# Patient Record
Sex: Male | Born: 1935 | Race: White | Hispanic: No | Marital: Married | Smoking: Former smoker
Health system: Southern US, Community
[De-identification: ages and names within clinical notes are randomized; demographics above are authoritative.]

## PROBLEM LIST (undated history)

## (undated) DIAGNOSIS — M7580 Other shoulder lesions, unspecified shoulder: Secondary | ICD-10-CM

## (undated) DIAGNOSIS — I10 Essential (primary) hypertension: Secondary | ICD-10-CM

## (undated) DIAGNOSIS — M778 Other enthesopathies, not elsewhere classified: Secondary | ICD-10-CM

## (undated) DIAGNOSIS — E7800 Pure hypercholesterolemia, unspecified: Secondary | ICD-10-CM

## (undated) DIAGNOSIS — S46219A Strain of muscle, fascia and tendon of other parts of biceps, unspecified arm, initial encounter: Secondary | ICD-10-CM

## (undated) DIAGNOSIS — C801 Malignant (primary) neoplasm, unspecified: Secondary | ICD-10-CM

## (undated) HISTORY — PX: EYE SURGERY: SHX253

## (undated) HISTORY — PX: TONSILLECTOMY: SUR1361

## (undated) HISTORY — PX: APPENDECTOMY: SHX54

## (undated) HISTORY — PX: OTHER SURGICAL HISTORY: SHX169

## (undated) HISTORY — PX: VASECTOMY: SHX75

## (undated) HISTORY — PX: THYROIDECTOMY: SHX17

---

## 1999-07-19 ENCOUNTER — Encounter: Payer: Self-pay | Admitting: General Surgery

## 1999-07-20 ENCOUNTER — Ambulatory Visit (HOSPITAL_COMMUNITY): Admission: RE | Admit: 1999-07-20 | Discharge: 1999-07-20 | Payer: Self-pay | Admitting: General Surgery

## 1999-07-20 ENCOUNTER — Encounter (INDEPENDENT_AMBULATORY_CARE_PROVIDER_SITE_OTHER): Payer: Self-pay | Admitting: Specialist

## 1999-07-20 ENCOUNTER — Encounter: Payer: Self-pay | Admitting: General Surgery

## 1999-07-20 ENCOUNTER — Inpatient Hospital Stay (HOSPITAL_COMMUNITY): Admission: RE | Admit: 1999-07-20 | Discharge: 1999-07-21 | Payer: Self-pay | Admitting: General Surgery

## 1999-08-11 ENCOUNTER — Encounter (INDEPENDENT_AMBULATORY_CARE_PROVIDER_SITE_OTHER): Payer: Self-pay | Admitting: Specialist

## 1999-08-11 ENCOUNTER — Observation Stay (HOSPITAL_COMMUNITY): Admission: RE | Admit: 1999-08-11 | Discharge: 1999-08-12 | Payer: Self-pay | Admitting: General Surgery

## 2003-10-14 ENCOUNTER — Encounter (INDEPENDENT_AMBULATORY_CARE_PROVIDER_SITE_OTHER): Payer: Self-pay | Admitting: *Deleted

## 2003-10-14 ENCOUNTER — Ambulatory Visit (HOSPITAL_COMMUNITY): Admission: RE | Admit: 2003-10-14 | Discharge: 2003-10-14 | Payer: Self-pay | Admitting: Gastroenterology

## 2010-07-21 LAB — COMPREHENSIVE METABOLIC PANEL
ALT: 17 U/L (ref 0–53)
AST: 22 U/L (ref 0–37)
Albumin: 3.8 g/dL (ref 3.5–5.2)
Alkaline Phosphatase: 52 U/L (ref 39–117)
BUN: 12 mg/dL (ref 6–23)
CO2: 29 mEq/L (ref 19–32)
Calcium: 9.4 mg/dL (ref 8.4–10.5)
Chloride: 105 mEq/L (ref 96–112)
Creatinine, Ser: 1.41 mg/dL (ref 0.4–1.5)
GFR calc Af Amer: 58 mL/min — ABNORMAL LOW (ref >60)
GFR calc non Af Amer: 48 mL/min — ABNORMAL LOW (ref >60)
Glucose, Bld: 97 mg/dL (ref 70–99)
Potassium: 4.6 mEq/L (ref 3.5–5.1)
Sodium: 140 mEq/L (ref 135–145)
Total Bilirubin: 1.7 mg/dL — ABNORMAL HIGH (ref 0.3–1.2)
Total Protein: 7.2 g/dL (ref 6.0–8.3)

## 2010-07-21 LAB — URINALYSIS, ROUTINE W REFLEX MICROSCOPIC
Bilirubin Urine: NEGATIVE
Hgb urine dipstick: NEGATIVE
Ketones, ur: NEGATIVE mg/dL
Nitrite: NEGATIVE
Protein, ur: NEGATIVE mg/dL
Specific Gravity, Urine: 1.016 (ref 1.005–1.030)
Urine Glucose, Fasting: NEGATIVE mg/dL
Urobilinogen, UA: 0.2 mg/dL (ref 0.0–1.0)
pH: 7 (ref 5.0–8.0)

## 2010-07-21 LAB — SURGICAL PCR SCREEN
MRSA, PCR: NEGATIVE
Staphylococcus aureus: NEGATIVE

## 2010-07-21 LAB — CBC
HCT: 48.2 % (ref 39.0–52.0)
Hemoglobin: 16.3 g/dL (ref 13.0–17.0)
MCH: 33.5 pg (ref 26.0–34.0)
MCHC: 33.8 g/dL (ref 30.0–36.0)
MCV: 99 fL (ref 78.0–100.0)
Platelets: 208 10*3/uL (ref 150–400)
RBC: 4.87 MIL/uL (ref 4.22–5.81)
RDW: 13.7 % (ref 11.5–15.5)
WBC: 6.5 10*3/uL (ref 4.0–10.5)

## 2010-07-21 LAB — PROTIME-INR
INR: 0.98 (ref 0.00–1.49)
Prothrombin Time: 13.2 seconds (ref 11.6–15.2)

## 2010-07-21 LAB — APTT: aPTT: 31 seconds (ref 24–37)

## 2010-07-26 ENCOUNTER — Inpatient Hospital Stay (HOSPITAL_COMMUNITY)
Admission: RE | Admit: 2010-07-26 | Discharge: 2010-07-29 | Payer: Self-pay | Source: Home / Self Care | Attending: Orthopedic Surgery | Admitting: Orthopedic Surgery

## 2010-07-26 NOTE — H&P (Addendum)
[name], [name] NO.:  [id number]  MEDICAL RECORD NO.:  [id number]          PATIENT TYPE:  INP  LOCATION:  0099                         FACILITY:  [facility]  PHYSICIAN:  [name], M.D.    DATE OF BIRTH:  08/20/1925  DATE OF ADMISSION:  07/26/2010 DATE OF DISCHARGE:                             HISTORY & PHYSICAL   CHIEF COMPLAINT:  Right hip pain.  BRIEF HISTORY:  [name] came in to see [name] back in October for evaluation of his right hip.  He has been having problems with the right hip for about a year now and it has gotten progressively worse throughout the year.  The pain is in his groin and down the lateral hip. He has decrease range of motion and he has limited function.  He is unable to do things he would like to do.  He is not getting any back pain, numbness or tingling in the leg.  The pain is also keeping him up at nighttime.  He now presents for right total hip arthroplasty. Primary care physician is [name] and he does have clearance.  ALLERGIES:  Denies any allergies to medication.  CURRENT MEDICATIONS: 1. Levoxyl 2. Uroxatral. 3. Lisinopril.  PAST MEDICAL HISTORY: 1. End-stage arthritis of the right hip. 2. Impaired vision.  He wears glasses. 3. Impaired hearing. 4. Tinnitus. 5. History of shingles about a year ago. 6. Cataracts. 7. Dentures, he has a partial on top. 8. Hypertension. 9. Hyperlipidemia. 10.Enlarged prostate. 11.Thyroid disease. 12.History of thyroid cancer.  PAST SURGICAL HISTORY: 1. Appendectomy. 2. Thyroidectomy. 3. Cataract surgery. 4. Hernia repair.  FAMILY HISTORY:  Father passed at the age of 67 of heart disease. Mother passed at the age of 17 also of heart disease.  SOCIAL HISTORY:  The patient is married.  He admits past use of tobacco products.  He has not smoked in about 50 years.  Denies use of alcohol. He has 4 children.  He lives at home with his family.  He does plan to go  home following his hospital stay as family is lined up to be his caregiver.  REVIEW OF SYSTEMS:  GENERAL:  Negative for fevers, chills or weight change.  HEENT/NEURO:  Negative for headache, blurred vision or double vision.  DERMATOLOGIC:  Negative for rash or lesion.  RESPIRATORY: Negative for shortness of breath at rest or with exertion.  CHEST:  Last chest x-ray was back in 1997.  CARDIOVASCULAR:  Negative for chest pain or palpitations, last EKG was last week.  GI:  Negative for nausea, vomiting, diarrhea.  GU:  Negative for hematuria, dysuria. MUSCULOSKELETAL:  Positive for joint pain.  PHYSICAL EXAMINATION:  VITAL SIGNS:  Pulse 68, respirations 18, blood pressure 160/62 in the left arm. GENERAL:  [name] is alert and oriented x3.  He is well-developed, well-nourished, no apparent distress.  He is accompanied today by his wife. HEENT:  Normocephalic, atraumatic.  Extraocular movements intact.  The patient is wearing glasses.  He has partial dentures on top. NECK:  Supple.  Full range of motion without lymphadenopathy. CHEST:  Lungs are clear  to auscultation bilaterally without wheezes. HEART:  Regular rate and rhythm without murmur. ABDOMEN:  Bowel sounds present in all 4 quadrants.  Abdomen is soft, nontender, nondistended to palpation. EXTREMITIES:  Right hip flexion to 95 degrees.  No internal rotation. About 20 degrees of external rotation and 20 degrees of abduction. SKIN:  Unremarkable. NEUROLOGIC:  Intact. PERIPHERAL VASCULAR:  Carotid pulses 2+ bilaterally without bruit.  RADIOGRAPHS:  AP and lateral views of the right hip reveals severe end- stage arthritis with collapse of the femoral head and bone-on-bone changes.  IMPRESSION:  End-stage arthritis of the right hip.  PLAN:  Right total hip arthroplasty to a performed by [name].  The patient has been cleared for surgery by [name].     [name], PAC   ______________________________ [name], M.D.   LD/MEDQ  D:  07/26/2010  T:  07/26/2010  Job:  [id number]  cc:   Deatra [name], M.D. Fax: [phone]  Electronically Signed by [name]  on 07/26/2010 03:31:54 PM Electronically Signed by [name] M.D. on 08/04/2010 03:37:03 PM

## 2010-07-27 LAB — TYPE AND SCREEN
ABO/RH(D): A POS
Antibody Screen: NEGATIVE

## 2010-07-27 LAB — ABO/RH: ABO/RH(D): A POS

## 2010-07-28 LAB — BASIC METABOLIC PANEL
BUN: 8 mg/dL (ref 6–23)
BUN: 8 mg/dL (ref 6–23)
CO2: 26 mEq/L (ref 19–32)
CO2: 25 mEq/L (ref 19–32)
Calcium: 8 mg/dL — ABNORMAL LOW (ref 8.4–10.5)
Calcium: 7.6 mg/dL — ABNORMAL LOW (ref 8.4–10.5)
Chloride: 105 mEq/L (ref 96–112)
Chloride: 108 mEq/L (ref 96–112)
Creatinine, Ser: 1.28 mg/dL (ref 0.4–1.5)
Creatinine, Ser: 1.24 mg/dL (ref 0.4–1.5)
GFR calc Af Amer: >60 mL/min (ref >60)
GFR calc Af Amer: >60 mL/min (ref >60)
GFR calc non Af Amer: 54 mL/min — ABNORMAL LOW (ref >60)
GFR calc non Af Amer: 56 mL/min — ABNORMAL LOW (ref >60)
Glucose, Bld: 147 mg/dL — ABNORMAL HIGH (ref 70–99)
Glucose, Bld: 116 mg/dL — ABNORMAL HIGH (ref 70–99)
Potassium: 4.8 mEq/L (ref 3.5–5.1)
Potassium: 4.1 mEq/L (ref 3.5–5.1)
Sodium: 138 mEq/L (ref 135–145)
Sodium: 138 mEq/L (ref 135–145)

## 2010-07-28 LAB — PROTIME-INR
INR: 1.16 (ref 0.00–1.49)
INR: 1.64 — ABNORMAL HIGH (ref 0.00–1.49)
Prothrombin Time: 15 seconds (ref 11.6–15.2)
Prothrombin Time: 19.6 seconds — ABNORMAL HIGH (ref 11.6–15.2)

## 2010-07-28 LAB — CBC
HCT: 35.1 % — ABNORMAL LOW (ref 39.0–52.0)
HCT: 31.4 % — ABNORMAL LOW (ref 39.0–52.0)
Hemoglobin: 12 g/dL — ABNORMAL LOW (ref 13.0–17.0)
Hemoglobin: 10.8 g/dL — ABNORMAL LOW (ref 13.0–17.0)
MCH: 32.8 pg (ref 26.0–34.0)
MCH: 32.9 pg (ref 26.0–34.0)
MCHC: 34.2 g/dL (ref 30.0–36.0)
MCHC: 34.4 g/dL (ref 30.0–36.0)
MCV: 95.9 fL (ref 78.0–100.0)
MCV: 95.7 fL (ref 78.0–100.0)
Platelets: 192 10*3/uL (ref 150–400)
Platelets: 178 10*3/uL (ref 150–400)
RBC: 3.66 MIL/uL — ABNORMAL LOW (ref 4.22–5.81)
RBC: 3.28 MIL/uL — ABNORMAL LOW (ref 4.22–5.81)
RDW: 13.5 % (ref 11.5–15.5)
RDW: 13.6 % (ref 11.5–15.5)
WBC: 6.4 10*3/uL (ref 4.0–10.5)
WBC: 6.7 10*3/uL (ref 4.0–10.5)

## 2010-07-29 LAB — CBC
HCT: 30.2 % — ABNORMAL LOW (ref 39.0–52.0)
Hemoglobin: 10.3 g/dL — ABNORMAL LOW (ref 13.0–17.0)
MCH: 32.7 pg (ref 26.0–34.0)
MCHC: 34.1 g/dL (ref 30.0–36.0)
MCV: 95.9 fL (ref 78.0–100.0)
Platelets: 174 10*3/uL (ref 150–400)
RBC: 3.15 MIL/uL — ABNORMAL LOW (ref 4.22–5.81)
RDW: 13.6 % (ref 11.5–15.5)
WBC: 6.7 10*3/uL (ref 4.0–10.5)

## 2010-07-29 LAB — PROTIME-INR
INR: 1.45 (ref 0.00–1.49)
Prothrombin Time: 17.8 seconds — ABNORMAL HIGH (ref 11.6–15.2)

## 2010-08-04 NOTE — Op Note (Signed)
NAMEKOEN, ANTILLA NO.:  [id number]  MEDICAL RECORD NO.:  [id number]          PATIENT TYPE:  INP  LOCATION:  1610                         FACILITY:  [facility]  PHYSICIAN:  Ollen Gross, M.D.    DATE OF BIRTH:  February 06, 1926  DATE OF PROCEDURE:  07/26/2010 DATE OF DISCHARGE:                              OPERATIVE REPORT   PREOPERATIVE DIAGNOSIS:  Osteoarthritis, right hip.  POSTOPERATIVE DIAGNOSIS:  Osteoarthritis, right hip.  PROCEDURE:  Right total hip arthroplasty.  SURGEON:  Ollen Gross, M.D.  ASSISTANT:  Alexzandrew L. Perkins, P.A.C.  ANESTHESIA:  General.  ESTIMATED BLOOD LOSS:  200.  DRAIN:  Hemovac x1.  COMPLICATIONS:  None.  CONDITION:  Stable.  CLINICAL NOTE:  Mr. Daywalt is an 77 year old male with severe end-stage arthritis of the right hip with progressively worsening pain and dysfunction.  He has failed nonoperative management, presents for right total hip arthroplasty.  PROCEDURE IN DETAIL:  After successful administration of general anesthetic, the patient is placed in left lateral decubitus position with the right side up and held with the hip positioner.  Right lower extremity was isolated from his perineum with plastic drapes and prepped and draped in the usual sterile fashion.  Short posterolateral incision was made with a 10 blade through subcutaneous tissue to level of the fascia lata which was incised in line with the skin incision.  The sciatic nerve was palpated and protected and short rotators and capsule were isolated off the femur.  The hip was then dislocated and the center of femoral head is marked.  Trial prosthesis was placed such that the center of the trial head corresponds to the center of his native femoral head.  Osteotomy line is marked on the femoral neck and osteotomy made with an oscillating saw.  Femoral head is removed and the femur is then exposed for preparation of the proximal femur.  Femoral  retractors were placed and then the starter reamer was passed off the femoral canal.  We did axial reaming up to 11.5 mm and proximal reaming up to a 16 F and the sleeve is machined to an extra, extra large.  A 16 F extra, extra large trial sleeve was then placed.  The femur is retracted anteriorly to gain acetabular exposure. Acetabular retractors were placed and then the labrum and osteophytes were removed.  Acetabular reaming starts at 47 mm, coursing increments of 2-53 mm and then a 54-mm Pinnacle acetabular shell was placed in an anatomic position with outstanding purchase.  I did not place any additional dome screws.  Apex hole eliminator was placed in a 36 mm neutral +4 Marathon liner was placed.  We then placed the trial femur which is a 16 x 11 with a 36 plus 6 neck about 10 degrees beyond native anteversion.  A 36+ zero head was placed and reduced this easily to 36 +3 which had more appropriate soft-tissue tension.  Stability was fantastic with full extension, full external rotation, 70 degrees flexion, 40 degrees adduction and 90 degrees internal rotation and 90 degrees of flexion and 70 degrees of internal rotation.  By placing the right  leg on top of the left, we felt that the leg lengths were equal. Hip was dislocated.  All trials were removed.  The permanent 40 F extra- extra large sleeve was placed, 16 x 11 stem, 36 plus 6 neck 10 degrees beyond native anteversion, 36 plus 3 head was placed, the hips were reduced to the same stability parameters.  Wound was copiously irrigated with saline solution and the capsule and short rotators were reattached to the femur through drill holes with Ethibond suture.  Fascia lata was closed over Hemovac drain with interrupted #1 Vicryl subcu, closed with #1 and #2-0 Vicryl subcuticular running 4-0 Monocryl.  The incision was then cleaned and dried and the catheter for Marcaine pain pump was placed.  Steri-Strips and bulky sterile  dressings were applied.  Drains hooked to suction and he was placed into a knee immobilizer, awakened and transported to recovery in stable condition.     Ollen Gross, M.D.     FA/MEDQ  D:  07/26/2010  T:  07/26/2010  Job:  [id number]  Electronically Signed by Ollen Gross M.D. on 08/04/2010 03:37:06 PM

## 2010-08-18 NOTE — Discharge Summary (Signed)
[name], [name] NO.:  [id number]  MEDICAL RECORD NO.:  [id number]          PATIENT TYPE:  INP  LOCATION:  1610                         FACILITY:  [facility]  PHYSICIAN:  Ollen Gross, M.D.    DATE OF BIRTH:  [birth year]  DATE OF ADMISSION:  07/26/2010 DATE OF DISCHARGE:  07/29/2010                              DISCHARGE SUMMARY   ADMITTING DIAGNOSES: 1. Osteoarthritis, right hip. 2. Impaired vision. 3. Impaired hearing. 4. Tinnitus. 5. History of shingles. 6. Cataracts. 7. Hypertension. 8. Hyperlipidemia. 9. Enlarged prostate. 10.Thyroid disease. 11.History of thyroid cancer.  DISCHARGE DIAGNOSES: 1. Osteoarthritis right hip, status post right total hip replacement     arthroplasty. 2. Postoperative acute blood loss anemia. 3. Impaired vision. 4. Impaired hearing. 5. Tinnitus. 6. History of shingles. 7. Cataracts. 8. Hypertension. 9. Hyperlipidemia. 10.Enlarged prostate. 11.Thyroid disease. 12.History of thyroid cancer.  PROCEDURE:  On July 26, 2010, right total hip.  SURGEON:  Ollen Gross, M.D.  ASSISTANT:  Alexzandrew L. Perkins, P.A.C.  ANESTHESIA:  General.  CONSULTS:  None.  BRIEF HISTORY:  [name] is an 77 year old male with severe end-stage arthritis of the right hip with progressive worsening pain and dysfunction, planned operative management, now presents for total hip arthroplasty.  LABORATORY DATA:  Preop CBC showed hemoglobin of 16.3, hematocrit of 48.2, white cell count 6.5, and platelets 208,000.  Postop hemoglobin down to 12 and 10.8.  Last known H and H 10.3 with hematocrit of 30.2. PT/INR 13.2 and 0.98 on admission with a PTT of 31.  Serial pro-times followed per Coumadin protocol.  Last known PT/INR of 17.8 and 1.45. Chem panel on admission, slightly elevated total bili of 1.7.  Remaining Chem panel within normal limits.  Serial BMETs were followed for 48 hours.  Glucose went up from 97 to 147, back down to  116.  Electrolytes have all remained within normal limits.  Preop UA negative.  Blood group type A+.  Nasal swabs were negative for staph aureus and negative for MRSA.  X-RAYS:  Chest x-ray on July 20, 2010, no acute cardiopulmonary disease.  Right hip films on July 20, 2010, right greater than left bilateral hip osteoarthritis, no acute findings; L4-L5 degenerative disk disease spondylosis.  Postoperative hip films and pelvis films, good appearance following total hip replacement on the right.  HOSPITAL COURSE:  The patient admitted to [facility], taken to OR, underwent above-stated procedure without complication.  The patient tolerated the procedure well, later transferred to recovery room orthopedic floor, given 24 hours postop IV antibiotics.  Started on Coumadin for DVT prophylaxis.  Given p.o. and IV analgesics.  The patient was able to get some sleep on the evening of surgery, had excellent urinary output.  We got discharge planning involved because he wanted to go home.  Hemovac drain placed on the surgery was pulled on day #1 without difficulty.  Started getting up out of bed.  On day #2, he was already progressing well with his physical therapy, walking in the hallway.  Family was in the room and went over discharge instructions, dressing changed on day #2.  His hemoglobin  looked good. Incision looked good.  He was felt to be ready by the next day.  On day #3, he was seen on rounds.  Hemoglobin was stable at 10.3.  He is tolerating his meds.  He was meeting all his goals and discharged home.  DISCHARGE PLAN: 1. The patient discharged home on July 29, 2010. 2. Discharge diagnoses, please see above.  DISCHARGE MEDICATIONS: 1. Percocet. 2. Robaxin. 3. Coumadin. 4. Continue home meds of Levoxyl, lisinopril, and Uroxatral.  DIET:  Heart-healthy diet.  ACTIVITIES:  Partial weightbearing to 25-50%, hip precaution total hip protocol.  FOLLOWUP:  In 2  weeks.  DISPOSITION:  Home.  CONDITION ON DISCHARGE:  Improved.     Alexzandrew L. Julien Girt, P.A.C.   ______________________________ Ollen Gross, M.D.    ALP/MEDQ  D:  08/10/2010  T:  08/10/2010  Job:  [id number]  cc:   Deatra James, M.D. Fax: [phone]  Electronically Signed by Patrica Duel P.A.C. on 08/16/2010 08:24:16 AM Electronically Signed by Ollen Gross M.D. on 08/18/2010 02:52:35 PM

## 2010-11-19 NOTE — Op Note (Signed)
NAME:  [name], [name]                         ACCOUNT NO.:  [id number]   MEDICAL RECORD NO.:  [id number]                   PATIENT TYPE:  AMB   LOCATION:  ENDO                                 FACILITY:  [facility]   PHYSICIAN:  Graylin Shiver, M.D.                DATE OF BIRTH:  June 07, 1926   DATE OF PROCEDURE:  10/14/2003  DATE OF DISCHARGE:                                 OPERATIVE REPORT   PROCEDURE:  Colonoscopy with polypectomy.   INDICATIONS FOR PROCEDURE:  Screening.   CONSENT:  Informed consent was obtained after explanation of the risks of  bleeding, infection, and perforation.   PREMEDICATION:  Fentanyl 30 mcg IV, Versed 3.5 mg IV.   DESCRIPTION OF PROCEDURE:  With the patient in the left lateral decubitus  position, the rectal exam was performed.  No masses were felt.  The Olympus  colonoscope was inserted into the rectum and advanced around the colon to  the cecum.  Cecal landmarks were identified.  In the cecum on a fold was a 1  cm, elongated polyp which was snared in piecemeal fashion and removed by  snare cautery technique.  The polyp was retrieved.  The cautery site looked  good.  The ascending colon looked normal.  The transverse colon looked  normal.  The descending colon and sigmoid showed some scattered diverticula.  The rectum looked normal.  He tolerated the procedure well without  complications.   IMPRESSION:  1. Cecal polyp.  211.3.  2. Diverticulosis of the left colon.  562.10.                                               Graylin Shiver, M.D.    SFG/MEDQ  D:  10/14/2003  T:  10/14/2003  Job:  [id number]   cc:   Al Decant. Janey Greaser, MD  [address]  [place]  [place] [address]  Fax: [phone]

## 2012-04-18 ENCOUNTER — Other Ambulatory Visit (INDEPENDENT_AMBULATORY_CARE_PROVIDER_SITE_OTHER): Payer: Self-pay | Admitting: Otolaryngology

## 2012-04-18 DIAGNOSIS — H905 Unspecified sensorineural hearing loss: Secondary | ICD-10-CM

## 2012-04-24 ENCOUNTER — Ambulatory Visit
Admission: RE | Admit: 2012-04-24 | Discharge: 2012-04-24 | Disposition: A | Discharge status: Home / Self Care | Payer: Medicare Other | Source: Ambulatory Visit | Attending: Otolaryngology | Admitting: Otolaryngology

## 2012-04-24 DIAGNOSIS — H905 Unspecified sensorineural hearing loss: Secondary | ICD-10-CM

## 2012-04-24 MED ORDER — GADOBENATE DIMEGLUMINE 529 MG/ML IV SOLN
14.0000 mL | Freq: Once | INTRAVENOUS | Status: AC | PRN
  Administered 2012-04-24: 14 mL via INTRAVENOUS

## 2013-06-17 ENCOUNTER — Other Ambulatory Visit: Payer: Self-pay | Admitting: Dermatology

## 2014-03-05 ENCOUNTER — Other Ambulatory Visit: Payer: Self-pay

## 2014-05-27 ENCOUNTER — Ambulatory Visit: Payer: Self-pay | Admitting: Orthopedic Surgery

## 2014-05-27 NOTE — Progress Notes (Signed)
Preoperative surgical orders have been place into the Epic hospital system for [name] on 05/27/2014, 2:00 PM  by Mickel Crow for surgery on 06-16-2014.  Preop Total Hip - Anterior Approach orders including Experel Injecion, IV Tylenol, and IV Decadron as long as there are no contraindications to the above medications. Arlee Muslim, PA-C

## 2014-06-09 NOTE — Progress Notes (Signed)
Clearance note per chart Dr Nancy Fetter 12/18/2013

## 2014-06-10 ENCOUNTER — Ambulatory Visit (HOSPITAL_COMMUNITY)
Admission: RE | Admit: 2014-06-10 | Discharge: 2014-06-10 | Disposition: A | Discharge status: Home / Self Care | Payer: Medicare Other | Source: Ambulatory Visit | Attending: Anesthesiology | Admitting: Anesthesiology

## 2014-06-10 ENCOUNTER — Ambulatory Visit (HOSPITAL_COMMUNITY)
Admission: RE | Admit: 2014-06-10 | Discharge: 2014-06-10 | Disposition: A | Discharge status: Home / Self Care | Payer: Medicare Other | Source: Ambulatory Visit | Attending: Orthopedic Surgery | Admitting: Orthopedic Surgery

## 2014-06-10 ENCOUNTER — Encounter (HOSPITAL_COMMUNITY)
Admission: RE | Admit: 2014-06-10 | Discharge: 2014-06-10 | Disposition: A | Discharge status: Home / Self Care | Payer: Medicare Other | Source: Ambulatory Visit | Attending: Orthopedic Surgery | Admitting: Orthopedic Surgery

## 2014-06-10 ENCOUNTER — Encounter (HOSPITAL_COMMUNITY): Payer: Self-pay

## 2014-06-10 DIAGNOSIS — I10 Essential (primary) hypertension: Secondary | ICD-10-CM | POA: Diagnosis not present

## 2014-06-10 DIAGNOSIS — Z01818 Encounter for other preprocedural examination: Secondary | ICD-10-CM | POA: Diagnosis present

## 2014-06-10 DIAGNOSIS — M1612 Unilateral primary osteoarthritis, left hip: Secondary | ICD-10-CM

## 2014-06-10 DIAGNOSIS — M25551 Pain in right hip: Secondary | ICD-10-CM | POA: Insufficient documentation

## 2014-06-10 HISTORY — DX: Other enthesopathies, not elsewhere classified: M77.8

## 2014-06-10 HISTORY — DX: Other shoulder lesions, unspecified shoulder: M75.80

## 2014-06-10 HISTORY — DX: Malignant (primary) neoplasm, unspecified: C80.1

## 2014-06-10 HISTORY — DX: Essential (primary) hypertension: I10

## 2014-06-10 HISTORY — DX: Strain of muscle, fascia and tendon of other parts of biceps, unspecified arm, initial encounter: S46.219A

## 2014-06-10 HISTORY — DX: Pure hypercholesterolemia, unspecified: E78.00

## 2014-06-10 LAB — SURGICAL PCR SCREEN
MRSA, PCR: NEGATIVE
STAPHYLOCOCCUS AUREUS: NEGATIVE

## 2014-06-10 LAB — COMPREHENSIVE METABOLIC PANEL
ALBUMIN: 3.6 g/dL (ref 3.5–5.2)
ALK PHOS: 61 U/L (ref 39–117)
ALT: 18 U/L (ref 0–53)
AST: 25 U/L (ref 0–37)
Anion gap: 12 (ref 5–15)
BUN: 19 mg/dL (ref 6–23)
CO2: 26 mEq/L (ref 19–32)
Calcium: 9.7 mg/dL (ref 8.4–10.5)
Chloride: 102 mEq/L (ref 96–112)
Creatinine, Ser: 1.36 mg/dL — ABNORMAL HIGH (ref 0.50–1.35)
GFR calc Af Amer: 53 mL/min — ABNORMAL LOW (ref >90)
GFR calc non Af Amer: 45 mL/min — ABNORMAL LOW (ref >90)
Glucose, Bld: 93 mg/dL (ref 70–99)
Potassium: 5.1 mEq/L (ref 3.7–5.3)
SODIUM: 140 meq/L (ref 137–147)
TOTAL PROTEIN: 7.3 g/dL (ref 6.0–8.3)
Total Bilirubin: 0.9 mg/dL (ref 0.3–1.2)

## 2014-06-10 LAB — URINALYSIS, ROUTINE W REFLEX MICROSCOPIC
Bilirubin Urine: NEGATIVE
GLUCOSE, UA: NEGATIVE mg/dL
Hgb urine dipstick: NEGATIVE
Ketones, ur: NEGATIVE mg/dL
LEUKOCYTES UA: NEGATIVE
Nitrite: NEGATIVE
PROTEIN: NEGATIVE mg/dL
Specific Gravity, Urine: 1.019 (ref 1.005–1.030)
Urobilinogen, UA: 0.2 mg/dL (ref 0.0–1.0)
pH: 6 (ref 5.0–8.0)

## 2014-06-10 LAB — CBC
HCT: 45 % (ref 39.0–52.0)
Hemoglobin: 14.9 g/dL (ref 13.0–17.0)
MCH: 32.8 pg (ref 26.0–34.0)
MCHC: 33.1 g/dL (ref 30.0–36.0)
MCV: 99.1 fL (ref 78.0–100.0)
Platelets: 241 10*3/uL (ref 150–400)
RBC: 4.54 MIL/uL (ref 4.22–5.81)
RDW: 13.7 % (ref 11.5–15.5)
WBC: 6.4 10*3/uL (ref 4.0–10.5)

## 2014-06-10 LAB — PROTIME-INR
INR: 1.03 (ref 0.00–1.49)
Prothrombin Time: 13.6 seconds (ref 11.6–15.2)

## 2014-06-10 LAB — APTT: APTT: 30 s (ref 24–37)

## 2014-06-10 NOTE — Patient Instructions (Signed)
22 QAIS JOWERS  06/10/2014   Your procedure is scheduled on:  Monday, June 16, 2014  Report to [facility] Main Entrance and follow signs to  [facility] arrive at 10:45 AM.  Call this number if you have problems the morning of surgery [phone] or Presurgical Testing [phone].   Remember:  Do not eat food After Midnight but may take clear liquids till 7:45am day of surgery then nothing by mouth.  For Living Will and/or Health Care Power Attorney Forms: please provide copy for your medical record, may bring AM of surgery (forms should be already notarized-we do not provide this service).   Take these medicines the morning of surgery with A SIP OF WATER: Levothyroxine                               You may not have any metal on your body including hair pins and piercings  Do not wear jewelry, lotions, powders, or deodorant.  Men may shave face and neck.               Do not bring valuables to the hospital. [place].  Contacts, dentures or bridgework may not be worn into surgery.  Leave suitcase in the car. After surgery it may be brought to your room.  For patients admitted to the hospital, checkout time is 11:00 AM the day of discharge.   Special Instructions: review fact sheets for MRSA information, Blood Transfusion fact sheet, Incentive Spirometry.  Remember: Type/Screen "Blue armsbands"- may not be removed once applied(would result in being retested AM of surgery, if removed). ________________________________________________________________________  [facility] - Preparing for Surgery Before surgery, you can play an important role.  Because skin is not sterile, your skin needs to be as free of germs as possible.  You can reduce the number of germs on your skin by washing with CHG (chlorahexidine gluconate) soap before surgery.  CHG is an antiseptic cleaner which kills germs and bonds with the skin to continue killing  germs even after washing. Please DO NOT use if you have an allergy to CHG or antibacterial soaps.  If your skin becomes reddened/irritated stop using the CHG and inform your nurse when you arrive at Short Stay. Do not shave (including legs and underarms) for at least 48 hours prior to the first CHG shower.  You may shave your face/neck. Please follow these instructions carefully:  1.  Shower with CHG Soap the night before surgery and the  morning of Surgery.  2.  If you choose to wash your hair, wash your hair first as usual with your  normal  shampoo.  3.  After you shampoo, rinse your hair and body thoroughly to remove the  shampoo.                           4.  Use CHG as you would any other liquid soap.  You can apply chg directly  to the skin and wash                       Gently with a scrungie or clean washcloth.  5.  Apply the CHG Soap to your body ONLY FROM THE NECK DOWN.   Do not use on face/ open  Wound or open sores. Avoid contact with eyes, ears mouth and genitals (private parts).                       Wash face,  Genitals (private parts) with your normal soap.             6.  Wash thoroughly, paying special attention to the area where your surgery  will be performed.  7.  Thoroughly rinse your body with warm water from the neck down.  8.  DO NOT shower/wash with your normal soap after using and rinsing off  the CHG Soap.                9.  Pat yourself dry with a clean towel.            10.  Wear clean pajamas.            11.  Place clean sheets on your bed the night of your first shower and do not  sleep with pets. Day of Surgery : Do not apply any lotions/deodorants the morning of surgery.  Please wear clean clothes to the hospital/surgery center.  FAILURE TO FOLLOW THESE INSTRUCTIONS MAY RESULT IN THE CANCELLATION OF YOUR SURGERY PATIENT SIGNATURE_________________________________  NURSE  SIGNATURE__________________________________  ________________________________________________________________________    CLEAR LIQUID DIET   Foods Allowed                                                                     Foods Excluded  Coffee and tea, regular and decaf                             liquids that you cannot  Plain Jell-O in any flavor                                             see through such as: Fruit ices (not with fruit pulp)                                     milk, soups, orange juice  Iced Popsicles                                    All solid food Carbonated beverages, regular and diet                                    Cranberry, grape and apple juices Sports drinks like Gatorade Lightly seasoned clear broth or consume(fat free) Sugar, honey syrup  Sample Menu Breakfast                                Lunch  Supper Cranberry juice                    Beef broth                            Chicken broth Jell-O                                     Grape juice                           Apple juice Coffee or tea                        Jell-O                                      Popsicle                                                Coffee or tea                        Coffee or tea  _____________________________________________________________________   WHAT IS A BLOOD TRANSFUSION? Blood Transfusion Information  A transfusion is the replacement of blood or some of its parts. Blood is made up of multiple cells which provide different functions.  Red blood cells carry oxygen and are used for blood loss replacement.  White blood cells fight against infection.  Platelets control bleeding.  Plasma helps clot blood.  Other blood products are available for specialized needs, such as hemophilia or other clotting disorders. BEFORE THE TRANSFUSION  Who gives blood for transfusions?   Healthy volunteers who are fully evaluated  to make sure their blood is safe. This is blood bank blood. Transfusion therapy is the safest it has ever been in the practice of medicine. Before blood is taken from a donor, a complete history is taken to make sure that person has no history of diseases nor engages in risky social behavior (examples are intravenous drug use or sexual activity with multiple partners). The donor's travel history is screened to minimize risk of transmitting infections, such as malaria. The donated blood is tested for signs of infectious diseases, such as HIV and hepatitis. The blood is then tested to be sure it is compatible with you in order to minimize the chance of a transfusion reaction. If you or a relative donates blood, this is often done in anticipation of surgery and is not appropriate for emergency situations. It takes many days to process the donated blood. RISKS AND COMPLICATIONS Although transfusion therapy is very safe and saves many lives, the main dangers of transfusion include:  1. Getting an infectious disease. 2. Developing a transfusion reaction. This is an allergic reaction to something in the blood you were given. Every precaution is taken to prevent this. The decision to have a blood transfusion has been considered carefully by your caregiver before blood is given. Blood is not given unless the benefits outweigh the risks. AFTER THE TRANSFUSION  Right after receiving a blood transfusion, you will usually  feel much better and more energetic. This is especially true if your red blood cells have gotten low (anemic). The transfusion raises the level of the red blood cells which carry oxygen, and this usually causes an energy increase.  The nurse administering the transfusion will monitor you carefully for complications. HOME CARE INSTRUCTIONS  No special instructions are needed after a transfusion. You may find your energy is better. Speak with your caregiver about any limitations on activity for  underlying diseases you may have. SEEK MEDICAL CARE IF:   Your condition is not improving after your transfusion.  You develop redness or irritation at the intravenous (IV) site. SEEK IMMEDIATE MEDICAL CARE IF:  Any of the following symptoms occur over the next 12 hours:  Shaking chills.  You have a temperature by mouth above 102 F (38.9 C), not controlled by medicine.  Chest, back, or muscle pain.  People around you feel you are not acting correctly or are confused.  Shortness of breath or difficulty breathing.  Dizziness and fainting.  You get a rash or develop hives.  You have a decrease in urine output.  Your urine turns a dark color or changes to pink, red, or brown. Any of the following symptoms occur over the next 10 days:  You have a temperature by mouth above 102 F (38.9 C), not controlled by medicine.  Shortness of breath.  Weakness after normal activity.  The white part of the eye turns yellow (jaundice).  You have a decrease in the amount of urine or are urinating less often.  Your urine turns a dark color or changes to pink, red, or brown. Document Released: 06/17/2000 Document Revised: 09/12/2011 Document Reviewed: 02/04/2008 ExitCare Patient Information 2014 [place].  _______________________________________________________________________  Incentive Spirometer  An incentive spirometer is a tool that can help keep your lungs clear and active. This tool measures how well you are filling your lungs with each breath. Taking long deep breaths may help reverse or decrease the chance of developing breathing (pulmonary) problems (especially infection) following:  A long period of time when you are unable to move or be active. BEFORE THE PROCEDURE   If the spirometer includes an indicator to show your best effort, your nurse or respiratory therapist will set it to a desired goal.  If possible, sit up straight or lean slightly forward. Try not to  slouch.  Hold the incentive spirometer in an upright position. INSTRUCTIONS FOR USE  3. Sit on the edge of your bed if possible, or sit up as far as you can in bed or on a chair. 4. Hold the incentive spirometer in an upright position. 5. Breathe out normally. 6. Place the mouthpiece in your mouth and seal your lips tightly around it. 7. Breathe in slowly and as deeply as possible, raising the piston or the ball toward the top of the column. 8. Hold your breath for 3-5 seconds or for as long as possible. Allow the piston or ball to fall to the bottom of the column. 9. Remove the mouthpiece from your mouth and breathe out normally. 10. Rest for a few seconds and repeat Steps 1 through 7 at least 10 times every 1-2 hours when you are awake. Take your time and take a few normal breaths between deep breaths. 11. The spirometer may include an indicator to show your best effort. Use the indicator as a goal to work toward during each repetition. 12. After each set of 10 deep breaths, practice coughing to  be sure your lungs are clear. If you have an incision (the cut made at the time of surgery), support your incision when coughing by placing a pillow or rolled up towels firmly against it. Once you are able to get out of bed, walk around indoors and cough well. You may stop using the incentive spirometer when instructed by your caregiver.  RISKS AND COMPLICATIONS  Take your time so you do not get dizzy or light-headed.  If you are in pain, you may need to take or ask for pain medication before doing incentive spirometry. It is harder to take a deep breath if you are having pain. AFTER USE  Rest and breathe slowly and easily.  It can be helpful to keep track of a log of your progress. Your caregiver can provide you with a simple table to help with this. If you are using the spirometer at home, follow these instructions: [place] IF:   You are having difficultly using the  spirometer.  You have trouble using the spirometer as often as instructed.  Your pain medication is not giving enough relief while using the spirometer.  You develop fever of 100.5 F (38.1 C) or higher. SEEK IMMEDIATE MEDICAL CARE IF:   You cough up bloody sputum that had not been present before.  You develop fever of 102 F (38.9 C) or greater.  You develop worsening pain at or near the incision site. MAKE SURE YOU:   Understand these instructions.  Will watch your condition.  Will get help right away if you are not doing well or get worse. Document Released: 10/31/2006 Document Revised: 09/12/2011 Document Reviewed: 01/01/2007 [facility] Patient Information 2014 [place], [place].   ________________________________________________________________________

## 2014-06-12 NOTE — Progress Notes (Addendum)
Called and informed patient of time change for surgery   on Monday 06/16/14 and for patient to report to Short Stay at [facility] at [phone] am and nothing to eat or drink after midnight. Patient verbalized understanding of instructions.

## 2014-06-15 ENCOUNTER — Ambulatory Visit: Payer: Self-pay | Admitting: Orthopedic Surgery

## 2014-06-15 NOTE — H&P (Signed)
[name]. [name] DOB: Nov 22, 1925 Married / Language: English / Race: White Male Date of Admission:  06/16/2014 CC:  Left Hip Pain History of Present Illness  The patient is a 80 year old male who comes in  for a preoperative History and Physical. The patient is scheduled for a left total hip arthroplasty (anterior approach) to be performed by Dr. Dione Plover. [name], MD at [facility] on 06-16-2014. The patient is a 80 year old male who presents for follow up of their hip. The patient is being followed for their left hip pain and osteoarthritis. They are several months out from intra-articular injection. Symptoms reported today include: pain, aching and popping. The patient feels that they are doing poorly and report their pain level to be moderate. The patient has reported improvement of their symptoms with: Cortisone injections. [name] comes in for recheck of his left hip. He is having a lot of achy discomfort in his hip. His has had the right hip replaced and doing well following that procedure. He is now ready to proceed with the left side now. They have been treated conservatively in the past for the above stated problem and despite conservative measures, they continue to have progressive pain and severe functional limitations and dysfunction. They have failed non-operative management including home exercise, medications, and injections. It is felt that they would benefit from undergoing total joint replacement. Risks and benefits of the procedure have been discussed with the patient and they elect to proceed with surgery. There are no active contraindications to surgery such as ongoing infection or rapidly progressive neurological disease.  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 (when needed) Tamsulosin HCl (0.4MG  Capsule, Oral) Active. Levothyroxine Sodium (75MCG Tablet, Oral) Active. Lisinopril (10MG  Tablet, Oral) Active. Fish Oil Burp-Less (Oral) Specific dose unknown - Active. Multivital (Oral) Active. Aspirin EC (81MG  Tablet DR, Oral) Active.  Past Surgical History Vasectomy Total Hip Replacement right Cataract Surgery left Appendectomy Tonsillectomy Thyroidectomy; Total   Review of Systems  General Not Present- Chills, Fatigue, Fever, Memory Loss, Night Sweats, Weight Gain and Weight Loss. Skin Not Present- Eczema, Hives, Itching, Lesions and Rash. HEENT Present- Hearing Loss and Tinnitus. Not Present- Dentures, Double Vision, Headache and Visual Loss. Respiratory Not Present- Allergies, Chronic Cough, Coughing up blood, Shortness of breath at rest and Shortness of breath with exertion. Cardiovascular Not Present- Chest Pain, Difficulty Breathing Lying Down, Murmur, Palpitations, Racing/skipping heartbeats and Swelling. Gastrointestinal Not Present- Abdominal Pain, Bloody Stool,  Constipation, Diarrhea, Difficulty Swallowing, Heartburn, Jaundice, Loss of appetitie, Nausea and Vomiting. Male Genitourinary Present- Urinating at Night. Not Present- Blood in Urine, Discharge,  Flank Pain, Incontinence, Painful Urination, Urgency, Urinary frequency, Urinary Retention and Weak urinary stream. Musculoskeletal Present- Joint Pain and Morning Stiffness. Not Present- Back Pain, Joint Swelling, Muscle Pain, Muscle Weakness and Spasms. Neurological Not Present- Blackout spells, Difficulty with balance, Dizziness, Paralysis, Tremor and Weakness. Psychiatric Not Present- Insomnia.  Vitals  Weight: 145 lb Height: 66in Weight was reported by patient. Height was reported by patient. Body Surface Area: 1.74 m Body Mass Index: 23.4 kg/m  BP: 138/70 (Sitting, Right Arm, Standard)   Physical Exam General Mental Status -Alert, cooperative and good historian. General Appearance-pleasant, Not in acute distress. Orientation-Oriented X3. Build & Nutrition-Well nourished and Well developed. Note: Patient is an 80 year old male with continued hip pain. Patient is accompanied today by his wife.   Head and Neck Head-normocephalic, atraumatic . Neck Global Assessment - supple, no bruit auscultated on the right, no bruit auscultated on the left. Note: upper denture plate   Eye Vision-Wears corrective lenses. Pupil - Bilateral-Regular and Round. Motion - Bilateral-EOMI.  Chest and Lung Exam Auscultation Breath sounds - clear at anterior chest wall and clear at posterior chest wall. Adventitious sounds - No Adventitious sounds.  Cardiovascular Auscultation Rhythm - Regular rate and rhythm. Heart Sounds - S1 WNL and S2 WNL. Murmurs & Other Heart Sounds - Auscultation of the heart reveals - No Murmurs.  Abdomen Palpation/Percussion Tenderness - Abdomen is non-tender to palpation. Rigidity (guarding) - Abdomen is soft. Auscultation Auscultation of the  abdomen reveals - Bowel sounds normal.  Male Genitourinary Note: Not done, not pertinent to present illness   Musculoskeletal Note: On exam he is alert and oriented in no apparent distress appearing much younger than his stated age of 86. His left hip can be flexed to 95. No internal rotation, about 10 external rotation and 20 abduction. Right hip shows great motion with no discomfort.  RADIOGRAPHS: Radiographs are reviewed from last visit. AP pelvis and lateral of the hips show the prosthesis on the right in excellent position with on periprosthetic abnormalities. On the left he has severe bone on bone arthritis throughout with large osteophyte formation.   Assessment & Plan  Primary osteoarthritis of left hip (M16.12) Note:Plan is for a Left Total Replacement by Dr. Wynelle Link.  Plan is to go home with wife and daughter.  PCP - Dr. Nancy Fetter - Patient has been seen preoperatively and felt to be stable for surgery.  Topical TXA - Thyroid Cancer  Signed electronically by Joelene Millin, III PA-C

## 2014-06-16 ENCOUNTER — Inpatient Hospital Stay (HOSPITAL_COMMUNITY): Payer: Medicare Other | Admitting: *Deleted

## 2014-06-16 ENCOUNTER — Inpatient Hospital Stay (HOSPITAL_COMMUNITY): Payer: Medicare Other

## 2014-06-16 ENCOUNTER — Encounter (HOSPITAL_COMMUNITY)
Admission: RE | Disposition: A | Discharge status: Home / Self Care | Payer: Self-pay | Source: Ambulatory Visit | Attending: Orthopedic Surgery

## 2014-06-16 ENCOUNTER — Inpatient Hospital Stay (HOSPITAL_COMMUNITY)
Admission: RE | Admit: 2014-06-16 | Discharge: 2014-06-18 | DRG: 470 | Disposition: A | Discharge status: Home / Self Care | Payer: Medicare Other | Source: Ambulatory Visit | Attending: Orthopedic Surgery | Admitting: Orthopedic Surgery

## 2014-06-16 ENCOUNTER — Encounter (HOSPITAL_COMMUNITY): Payer: Self-pay | Admitting: *Deleted

## 2014-06-16 DIAGNOSIS — Z87891 Personal history of nicotine dependence: Secondary | ICD-10-CM | POA: Diagnosis not present

## 2014-06-16 DIAGNOSIS — M25552 Pain in left hip: Secondary | ICD-10-CM | POA: Diagnosis present

## 2014-06-16 DIAGNOSIS — Z96641 Presence of right artificial hip joint: Secondary | ICD-10-CM | POA: Diagnosis present

## 2014-06-16 DIAGNOSIS — M169 Osteoarthritis of hip, unspecified: Secondary | ICD-10-CM | POA: Diagnosis present

## 2014-06-16 DIAGNOSIS — E780 Pure hypercholesterolemia: Secondary | ICD-10-CM | POA: Diagnosis present

## 2014-06-16 DIAGNOSIS — M779 Enthesopathy, unspecified: Secondary | ICD-10-CM | POA: Diagnosis present

## 2014-06-16 DIAGNOSIS — M1612 Unilateral primary osteoarthritis, left hip: Principal | ICD-10-CM | POA: Diagnosis present

## 2014-06-16 DIAGNOSIS — E039 Hypothyroidism, unspecified: Secondary | ICD-10-CM | POA: Diagnosis present

## 2014-06-16 DIAGNOSIS — I10 Essential (primary) hypertension: Secondary | ICD-10-CM | POA: Diagnosis present

## 2014-06-16 DIAGNOSIS — Z96649 Presence of unspecified artificial hip joint: Secondary | ICD-10-CM

## 2014-06-16 HISTORY — PX: TOTAL HIP ARTHROPLASTY: SHX124

## 2014-06-16 LAB — TYPE AND SCREEN
ABO/RH(D): A POS
Antibody Screen: NEGATIVE

## 2014-06-16 SURGERY — ARTHROPLASTY, HIP, TOTAL, ANTERIOR APPROACH
Anesthesia: Spinal | Site: Hip | Laterality: Left

## 2014-06-16 MED ORDER — DEXAMETHASONE SODIUM PHOSPHATE 10 MG/ML IJ SOLN
10.0000 mg | Freq: Once | INTRAMUSCULAR | Status: AC
  Administered 2014-06-17: 10 mg via INTRAVENOUS
  Filled 2014-06-16: qty 1

## 2014-06-16 MED ORDER — LEVOTHYROXINE SODIUM 75 MCG PO TABS
75.0000 ug | ORAL_TABLET | Freq: Every day | ORAL | Status: DC
  Administered 2014-06-17 – 2014-06-18 (×2): 75 ug via ORAL
  Filled 2014-06-16 (×3): qty 1

## 2014-06-16 MED ORDER — CHLORHEXIDINE GLUCONATE 4 % EX LIQD: 60.0000 mL | Freq: Once | CUTANEOUS | Status: DC

## 2014-06-16 MED ORDER — SODIUM CHLORIDE 0.9 % IV SOLN: INTRAVENOUS | Status: DC

## 2014-06-16 MED ORDER — METHOCARBAMOL 500 MG PO TABS
500.0000 mg | ORAL_TABLET | Freq: Four times a day (QID) | ORAL | Status: DC | PRN
  Administered 2014-06-17: 500 mg via ORAL
  Filled 2014-06-16: qty 1

## 2014-06-16 MED ORDER — ONDANSETRON HCL 4 MG PO TABS: 4.0000 mg | ORAL_TABLET | Freq: Four times a day (QID) | ORAL | Status: DC | PRN

## 2014-06-16 MED ORDER — PROPOFOL 10 MG/ML IV BOLUS
INTRAVENOUS | Status: DC | PRN
  Administered 2014-06-16: 50 mg via INTRAVENOUS
  Administered 2014-06-16: 20 mg via INTRAVENOUS
  Administered 2014-06-16: 10 mg via INTRAVENOUS

## 2014-06-16 MED ORDER — TAMSULOSIN HCL 0.4 MG PO CAPS
0.4000 mg | ORAL_CAPSULE | Freq: Every day | ORAL | Status: DC
  Administered 2014-06-16 – 2014-06-17 (×2): 0.4 mg via ORAL
  Filled 2014-06-16 (×3): qty 1

## 2014-06-16 MED ORDER — SODIUM CHLORIDE 0.9 % IJ SOLN
INTRAMUSCULAR | Status: DC | PRN
  Administered 2014-06-16: 30 mL

## 2014-06-16 MED ORDER — ACETAMINOPHEN 325 MG PO TABS
650.0000 mg | ORAL_TABLET | Freq: Four times a day (QID) | ORAL | Status: DC | PRN
  Filled 2014-06-16: qty 2

## 2014-06-16 MED ORDER — PROPOFOL 10 MG/ML IV BOLUS
INTRAVENOUS | Status: AC
  Filled 2014-06-16: qty 20

## 2014-06-16 MED ORDER — ONDANSETRON HCL 4 MG/2ML IJ SOLN
INTRAMUSCULAR | Status: AC
  Filled 2014-06-16: qty 2

## 2014-06-16 MED ORDER — ACETAMINOPHEN 10 MG/ML IV SOLN
1000.0000 mg | Freq: Once | INTRAVENOUS | Status: AC
  Administered 2014-06-16: 1000 mg via INTRAVENOUS
  Filled 2014-06-16: qty 100

## 2014-06-16 MED ORDER — METHOCARBAMOL 1000 MG/10ML IJ SOLN
500.0000 mg | Freq: Four times a day (QID) | INTRAVENOUS | Status: DC | PRN
  Administered 2014-06-16: 500 mg via INTRAVENOUS
  Filled 2014-06-16 (×2): qty 5

## 2014-06-16 MED ORDER — ONDANSETRON HCL 4 MG/2ML IJ SOLN
INTRAMUSCULAR | Status: DC | PRN
  Administered 2014-06-16: 4 mg via INTRAVENOUS

## 2014-06-16 MED ORDER — POLYETHYLENE GLYCOL 3350 17 G PO PACK: 17.0000 g | PACK | Freq: Every day | ORAL | Status: DC | PRN

## 2014-06-16 MED ORDER — BUPIVACAINE HCL (PF) 0.5 % IJ SOLN
INTRAMUSCULAR | Status: DC | PRN
  Administered 2014-06-16: 3 mL

## 2014-06-16 MED ORDER — TRAMADOL HCL 50 MG PO TABS: 50.0000 mg | ORAL_TABLET | Freq: Four times a day (QID) | ORAL | Status: DC | PRN

## 2014-06-16 MED ORDER — ACETAMINOPHEN 500 MG PO TABS
1000.0000 mg | ORAL_TABLET | Freq: Four times a day (QID) | ORAL | Status: AC
  Administered 2014-06-16 – 2014-06-17 (×4): 1000 mg via ORAL
  Filled 2014-06-16 (×5): qty 2

## 2014-06-16 MED ORDER — EPHEDRINE SULFATE 50 MG/ML IJ SOLN
INTRAMUSCULAR | Status: DC | PRN
  Administered 2014-06-16: 10 mg via INTRAVENOUS
  Administered 2014-06-16 (×2): 5 mg via INTRAVENOUS

## 2014-06-16 MED ORDER — GLYCOPYRROLATE 0.2 MG/ML IJ SOLN
INTRAMUSCULAR | Status: DC | PRN
  Administered 2014-06-16 (×2): 0.1 mg via INTRAVENOUS

## 2014-06-16 MED ORDER — ACETAMINOPHEN 650 MG RE SUPP: 650.0000 mg | Freq: Four times a day (QID) | RECTAL | Status: DC | PRN

## 2014-06-16 MED ORDER — DEXAMETHASONE SODIUM PHOSPHATE 10 MG/ML IJ SOLN
INTRAMUSCULAR | Status: AC
  Filled 2014-06-16: qty 1

## 2014-06-16 MED ORDER — OXYCODONE HCL 5 MG PO TABS
5.0000 mg | ORAL_TABLET | ORAL | Status: DC | PRN
  Administered 2014-06-16 – 2014-06-17 (×2): 5 mg via ORAL
  Administered 2014-06-17: 10 mg via ORAL
  Administered 2014-06-17 – 2014-06-18 (×3): 5 mg via ORAL
  Filled 2014-06-16 (×4): qty 1
  Filled 2014-06-16: qty 2
  Filled 2014-06-16: qty 1

## 2014-06-16 MED ORDER — SODIUM CHLORIDE 0.9 % IJ SOLN
INTRAMUSCULAR | Status: AC
  Filled 2014-06-16: qty 50

## 2014-06-16 MED ORDER — MORPHINE SULFATE 2 MG/ML IJ SOLN
1.0000 mg | INTRAMUSCULAR | Status: DC | PRN
  Administered 2014-06-16: 1 mg via INTRAVENOUS
  Filled 2014-06-16: qty 1

## 2014-06-16 MED ORDER — PHENYLEPHRINE 40 MCG/ML (10ML) SYRINGE FOR IV PUSH (FOR BLOOD PRESSURE SUPPORT)
PREFILLED_SYRINGE | INTRAVENOUS | Status: AC
  Filled 2014-06-16: qty 10

## 2014-06-16 MED ORDER — LACTATED RINGERS IV SOLN
INTRAVENOUS | Status: DC
  Administered 2014-06-16: 12:00:00 via INTRAVENOUS
  Administered 2014-06-16: 1000 mL via INTRAVENOUS
  Administered 2014-06-16: 13:00:00 via INTRAVENOUS

## 2014-06-16 MED ORDER — TRANEXAMIC ACID 100 MG/ML IV SOLN
2000.0000 mg | INTRAVENOUS | Status: DC | PRN
  Administered 2014-06-16: 2000 mg via TOPICAL

## 2014-06-16 MED ORDER — LEVOTHYROXINE SODIUM 75 MCG PO TABS: 75.0000 ug | ORAL_TABLET | Freq: Every morning | ORAL | Status: DC

## 2014-06-16 MED ORDER — BISACODYL 10 MG RE SUPP: 10.0000 mg | Freq: Every day | RECTAL | Status: DC | PRN

## 2014-06-16 MED ORDER — MENTHOL 3 MG MT LOZG: 1.0000 | LOZENGE | OROMUCOSAL | Status: DC | PRN

## 2014-06-16 MED ORDER — EPHEDRINE SULFATE 50 MG/ML IJ SOLN
INTRAMUSCULAR | Status: AC
  Filled 2014-06-16: qty 1

## 2014-06-16 MED ORDER — GLYCOPYRROLATE 0.2 MG/ML IJ SOLN
INTRAMUSCULAR | Status: AC
  Filled 2014-06-16: qty 1

## 2014-06-16 MED ORDER — RIVAROXABAN 10 MG PO TABS
10.0000 mg | ORAL_TABLET | Freq: Every day | ORAL | Status: DC
  Administered 2014-06-17 – 2014-06-18 (×2): 10 mg via ORAL
  Filled 2014-06-16 (×3): qty 1

## 2014-06-16 MED ORDER — CEFAZOLIN SODIUM-DEXTROSE 2-3 GM-% IV SOLR
INTRAVENOUS | Status: AC
  Filled 2014-06-16: qty 50

## 2014-06-16 MED ORDER — DEXAMETHASONE SODIUM PHOSPHATE 10 MG/ML IJ SOLN: 10.0000 mg | Freq: Once | INTRAMUSCULAR | Status: DC

## 2014-06-16 MED ORDER — TRANEXAMIC ACID 100 MG/ML IV SOLN
2000.0000 mg | Freq: Once | INTRAVENOUS | Status: DC
  Filled 2014-06-16: qty 20

## 2014-06-16 MED ORDER — MEPERIDINE HCL 50 MG/ML IJ SOLN: 6.2500 mg | INTRAMUSCULAR | Status: DC | PRN

## 2014-06-16 MED ORDER — CEFAZOLIN SODIUM-DEXTROSE 2-3 GM-% IV SOLR
2.0000 g | INTRAVENOUS | Status: AC
  Administered 2014-06-16: 2 g via INTRAVENOUS

## 2014-06-16 MED ORDER — DEXTROSE-NACL 5-0.9 % IV SOLN
INTRAVENOUS | Status: DC
  Administered 2014-06-16: 17:00:00 via INTRAVENOUS

## 2014-06-16 MED ORDER — BUPIVACAINE HCL (PF) 0.5 % IJ SOLN
INTRAMUSCULAR | Status: AC
  Filled 2014-06-16: qty 30

## 2014-06-16 MED ORDER — DEXAMETHASONE SODIUM PHOSPHATE 10 MG/ML IJ SOLN
INTRAMUSCULAR | Status: DC | PRN
  Administered 2014-06-16: 10 mg via INTRAVENOUS

## 2014-06-16 MED ORDER — METOCLOPRAMIDE HCL 10 MG PO TABS: 5.0000 mg | ORAL_TABLET | Freq: Three times a day (TID) | ORAL | Status: DC | PRN

## 2014-06-16 MED ORDER — DOCUSATE SODIUM 100 MG PO CAPS
100.0000 mg | ORAL_CAPSULE | Freq: Two times a day (BID) | ORAL | Status: DC
  Administered 2014-06-16 – 2014-06-18 (×4): 100 mg via ORAL

## 2014-06-16 MED ORDER — FLEET ENEMA 7-19 GM/118ML RE ENEM: 1.0000 | ENEMA | Freq: Once | RECTAL | Status: AC | PRN

## 2014-06-16 MED ORDER — ONDANSETRON HCL 4 MG/2ML IJ SOLN: 4.0000 mg | Freq: Four times a day (QID) | INTRAMUSCULAR | Status: DC | PRN

## 2014-06-16 MED ORDER — METOCLOPRAMIDE HCL 5 MG/ML IJ SOLN: 5.0000 mg | Freq: Three times a day (TID) | INTRAMUSCULAR | Status: DC | PRN

## 2014-06-16 MED ORDER — METOCLOPRAMIDE HCL 5 MG/ML IJ SOLN
INTRAMUSCULAR | Status: AC
  Filled 2014-06-16: qty 2

## 2014-06-16 MED ORDER — METOCLOPRAMIDE HCL 5 MG/ML IJ SOLN
INTRAMUSCULAR | Status: DC | PRN
  Administered 2014-06-16: 10 mg via INTRAVENOUS

## 2014-06-16 MED ORDER — PROMETHAZINE HCL 25 MG/ML IJ SOLN: 6.2500 mg | INTRAMUSCULAR | Status: DC | PRN

## 2014-06-16 MED ORDER — FENTANYL CITRATE 0.05 MG/ML IJ SOLN: 25.0000 ug | INTRAMUSCULAR | Status: DC | PRN

## 2014-06-16 MED ORDER — BUPIVACAINE LIPOSOME 1.3 % IJ SUSP
INTRAMUSCULAR | Status: DC | PRN
  Administered 2014-06-16: 20 mL

## 2014-06-16 MED ORDER — BUPIVACAINE HCL (PF) 0.25 % IJ SOLN
INTRAMUSCULAR | Status: AC
  Filled 2014-06-16: qty 30

## 2014-06-16 MED ORDER — BUPIVACAINE LIPOSOME 1.3 % IJ SUSP
20.0000 mL | Freq: Once | INTRAMUSCULAR | Status: DC
  Filled 2014-06-16: qty 20

## 2014-06-16 MED ORDER — PHENOL 1.4 % MT LIQD: 1.0000 | OROMUCOSAL | Status: DC | PRN

## 2014-06-16 MED ORDER — BUPIVACAINE HCL (PF) 0.25 % IJ SOLN
INTRAMUSCULAR | Status: DC | PRN
  Administered 2014-06-16: 20 mL

## 2014-06-16 MED ORDER — PROPOFOL INFUSION 10 MG/ML OPTIME
INTRAVENOUS | Status: DC | PRN
  Administered 2014-06-16: 100 ug/kg/min via INTRAVENOUS

## 2014-06-16 MED ORDER — DIPHENHYDRAMINE HCL 12.5 MG/5ML PO ELIX
12.5000 mg | ORAL_SOLUTION | ORAL | Status: DC | PRN
  Administered 2014-06-17: 12.5 mg via ORAL
  Filled 2014-06-16: qty 5

## 2014-06-16 MED ORDER — CEFAZOLIN SODIUM-DEXTROSE 2-3 GM-% IV SOLR
2.0000 g | Freq: Four times a day (QID) | INTRAVENOUS | Status: AC
  Administered 2014-06-16 – 2014-06-17 (×2): 2 g via INTRAVENOUS
  Filled 2014-06-16 (×2): qty 50

## 2014-06-16 MED ORDER — PHENYLEPHRINE HCL 10 MG/ML IJ SOLN
INTRAMUSCULAR | Status: DC | PRN
  Administered 2014-06-16: 40 ug via INTRAVENOUS
  Administered 2014-06-16 (×2): 80 ug via INTRAVENOUS
  Administered 2014-06-16 (×2): 40 ug via INTRAVENOUS
  Administered 2014-06-16: 120 ug via INTRAVENOUS

## 2014-06-16 SURGICAL SUPPLY — 41 items
BAG ZIPLOCK 12X15 (MISCELLANEOUS) IMPLANT
BLADE EXTENDED COATED 6.5IN (ELECTRODE) ×3 IMPLANT
BLADE SAG 18X100X1.27 (BLADE) ×3 IMPLANT
CAPT HIP TOTAL 2 ×3 IMPLANT
CLOSURE WOUND 1/2 X4 (GAUZE/BANDAGES/DRESSINGS) ×1
COVER PERINEAL POST (MISCELLANEOUS) ×3 IMPLANT
DECANTER SPIKE VIAL GLASS SM (MISCELLANEOUS) ×3 IMPLANT
DRAPE C-ARM 42X120 X-RAY (DRAPES) ×3 IMPLANT
DRAPE STERI IOBAN 125X83 (DRAPES) ×3 IMPLANT
DRAPE U-SHAPE 47X51 STRL (DRAPES) ×9 IMPLANT
DRSG ADAPTIC 3X8 NADH LF (GAUZE/BANDAGES/DRESSINGS) IMPLANT
DRSG MEPILEX BORDER 4X4 (GAUZE/BANDAGES/DRESSINGS) ×3 IMPLANT
DRSG MEPILEX BORDER 4X8 (GAUZE/BANDAGES/DRESSINGS) ×3 IMPLANT
DURAPREP 26ML APPLICATOR (WOUND CARE) ×3 IMPLANT
ELECT REM PT RETURN 9FT ADLT (ELECTROSURGICAL) ×3
ELECTRODE REM PT RTRN 9FT ADLT (ELECTROSURGICAL) ×1 IMPLANT
EVACUATOR 1/8 PVC DRAIN (DRAIN) ×3 IMPLANT
FACESHIELD WRAPAROUND (MASK) ×12 IMPLANT
GLOVE BIO SURGEON STRL SZ7.5 (GLOVE) ×3 IMPLANT
GLOVE BIO SURGEON STRL SZ8 (GLOVE) ×6 IMPLANT
GLOVE BIOGEL PI IND STRL 8 (GLOVE) ×2 IMPLANT
GLOVE BIOGEL PI INDICATOR 8 (GLOVE) ×4
GOWN STRL REUS W/TWL LRG LVL3 (GOWN DISPOSABLE) ×3 IMPLANT
GOWN STRL REUS W/TWL XL LVL3 (GOWN DISPOSABLE) ×3 IMPLANT
HEAD M SROM 36MM PLUS 1.5 (Hips) IMPLANT
KIT BASIN OR (CUSTOM PROCEDURE TRAY) ×3 IMPLANT
NDL SAFETY ECLIPSE 18X1.5 (NEEDLE) ×2 IMPLANT
NEEDLE HYPO 18GX1.5 SHARP (NEEDLE) ×4
PACK TOTAL JOINT (CUSTOM PROCEDURE TRAY) ×3 IMPLANT
SROM M HEAD 36MM PLUS 1.5 (Hips) IMPLANT
STRIP CLOSURE SKIN 1/2X4 (GAUZE/BANDAGES/DRESSINGS) ×2 IMPLANT
SUT ETHIBOND NAB CT1 #1 30IN (SUTURE) ×3 IMPLANT
SUT MNCRL AB 4-0 PS2 18 (SUTURE) ×3 IMPLANT
SUT VIC AB 2-0 CT1 27 (SUTURE) ×4
SUT VIC AB 2-0 CT1 TAPERPNT 27 (SUTURE) ×2 IMPLANT
SUT VLOC 180 0 24IN GS25 (SUTURE) ×3 IMPLANT
SYR 20CC LL (SYRINGE) ×3 IMPLANT
SYR 50ML LL SCALE MARK (SYRINGE) ×3 IMPLANT
TOWEL OR 17X26 10 PK STRL BLUE (TOWEL DISPOSABLE) ×3 IMPLANT
TRAY FOLEY CATH 14FRSI W/METER (CATHETERS) IMPLANT
TRAY FOLEY CATH 16FRSI W/METER (SET/KITS/TRAYS/PACK) ×3 IMPLANT

## 2014-06-16 NOTE — H&P (View-Only) (Signed)
Kirtland Bouchard. Beryl Meager DOB: February 27, 1926 Married / Language: English / Race: White Male Date of Admission:  06/16/2014 CC:  Left Hip Pain History of Present Illness  The patient is a 80 year old male who comes in  for a preoperative History and Physical. The patient is scheduled for a left total hip arthroplasty (anterior approach) to be performed by Dr. Dione Plover. [name], MD at [facility] on 06-16-2014. The patient is a 80 year old male who presents for follow up of their hip. The patient is being followed for their left hip pain and osteoarthritis. They are several months out from intra-articular injection. Symptoms reported today include: pain, aching and popping. The patient feels that they are doing poorly and report their pain level to be moderate. The patient has reported improvement of their symptoms with: Cortisone injections. Nick comes in for recheck of his left hip. He is having a lot of achy discomfort in his hip. His has had the right hip replaced and doing well following that procedure. He is now ready to proceed with the left side now. They have been treated conservatively in the past for the above stated problem and despite conservative measures, they continue to have progressive pain and severe functional limitations and dysfunction. They have failed non-operative management including home exercise, medications, and injections. It is felt that they would benefit from undergoing total joint replacement. Risks and benefits of the procedure have been discussed with the patient and they elect to proceed with surgery. There are no active contraindications to surgery such as ongoing infection or rapidly progressive neurological disease.  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 (when needed) Tamsulosin HCl (0.4MG  Capsule, Oral) Active. Levothyroxine Sodium (75MCG Tablet, Oral) Active. Lisinopril (10MG  Tablet, Oral) Active. Fish Oil Burp-Less (Oral) Specific dose unknown - Active. Multivital (Oral) Active. Aspirin EC (81MG  Tablet DR, Oral) Active.  Past Surgical History Vasectomy Total Hip Replacement right Cataract Surgery left Appendectomy Tonsillectomy Thyroidectomy; Total   Review of Systems  General Not Present- Chills, Fatigue, Fever, Memory Loss, Night Sweats, Weight Gain and Weight Loss. Skin Not Present- Eczema, Hives, Itching, Lesions and Rash. HEENT Present- Hearing Loss and Tinnitus. Not Present- Dentures, Double Vision, Headache and Visual Loss. Respiratory Not Present- Allergies, Chronic Cough, Coughing up blood, Shortness of breath at rest and Shortness of breath with exertion. Cardiovascular Not Present- Chest Pain, Difficulty Breathing Lying Down, Murmur, Palpitations, Racing/skipping heartbeats and Swelling. Gastrointestinal Not Present- Abdominal Pain, Bloody Stool,  Constipation, Diarrhea, Difficulty Swallowing, Heartburn, Jaundice, Loss of appetitie, Nausea and Vomiting. Male Genitourinary Present- Urinating at Night. Not Present- Blood in Urine, Discharge,  Flank Pain, Incontinence, Painful Urination, Urgency, Urinary frequency, Urinary Retention and Weak urinary stream. Musculoskeletal Present- Joint Pain and Morning Stiffness. Not Present- Back Pain, Joint Swelling, Muscle Pain, Muscle Weakness and Spasms. Neurological Not Present- Blackout spells, Difficulty with balance, Dizziness, Paralysis, Tremor and Weakness. Psychiatric Not Present- Insomnia.  Vitals  Weight: 145 lb Height: 66in Weight was reported by patient. Height was reported by patient. Body Surface Area: 1.74 m Body Mass Index: 23.4 kg/m  BP: 138/70 (Sitting, Right Arm, Standard)   Physical Exam General Mental Status -Alert, cooperative and good historian. General Appearance-pleasant, Not in acute distress. Orientation-Oriented X3. Build & Nutrition-Well nourished and Well developed. Note: Patient is an 80 year old male with continued hip pain. Patient is accompanied today by his wife.   Head and Neck Head-normocephalic, atraumatic . Neck Global Assessment - supple, no bruit auscultated on the right, no bruit auscultated on the left. Note: upper denture plate   Eye Vision-Wears corrective lenses. Pupil - Bilateral-Regular and Round. Motion - Bilateral-EOMI.  Chest and Lung Exam Auscultation Breath sounds - clear at anterior chest wall and clear at posterior chest wall. Adventitious sounds - No Adventitious sounds.  Cardiovascular Auscultation Rhythm - Regular rate and rhythm. Heart Sounds - S1 WNL and S2 WNL. Murmurs & Other Heart Sounds - Auscultation of the heart reveals - No Murmurs.  Abdomen Palpation/Percussion Tenderness - Abdomen is non-tender to palpation. Rigidity (guarding) - Abdomen is soft. Auscultation Auscultation of the  abdomen reveals - Bowel sounds normal.  Male Genitourinary Note: Not done, not pertinent to present illness   Musculoskeletal Note: On exam he is alert and oriented in no apparent distress appearing much younger than his stated age of 54. His left hip can be flexed to 95. No internal rotation, about 10 external rotation and 20 abduction. Right hip shows great motion with no discomfort.  RADIOGRAPHS: Radiographs are reviewed from last visit. AP pelvis and lateral of the hips show the prosthesis on the right in excellent position with on periprosthetic abnormalities. On the left he has severe bone on bone arthritis throughout with large osteophyte formation.   Assessment & Plan  Primary osteoarthritis of left hip (M16.12) Note:Plan is for a Left Total Replacement by Dr. Wynelle Link.  Plan is to go home with wife and daughter.  PCP - Dr. Nancy Fetter - Patient has been seen preoperatively and felt to be stable for surgery.  Topical TXA - Thyroid Cancer  Signed electronically by Joelene Millin, III PA-C

## 2014-06-16 NOTE — Progress Notes (Signed)
Dr. Marcell Barlow, anesthesiologist, in to see pt; OK for pt to go to room with spinal level  L2 and unable to move legs; he can rock legs back and forth but not bend them and cannot move feet/toes

## 2014-06-16 NOTE — Anesthesia Postprocedure Evaluation (Signed)
  Anesthesia Post-op Note  Patient: [name]  Procedure(s) Performed: Procedure(s) (LRB): LEFT TOTAL HIP ARTHROPLASTY ANTERIOR APPROACH (Left)  Patient Location: PACU  Anesthesia Type: Spinal  Level of Consciousness: awake and alert   Airway and Oxygen Therapy: Patient Spontanous Breathing  Post-op Pain: mild  Post-op Assessment: Post-op Vital signs reviewed, Patient's Cardiovascular Status Stable, Respiratory Function Stable, Patent Airway and No signs of Nausea or vomiting  Last Vitals:  Filed Vitals:   06/16/14 1815  BP: 115/50  Pulse: 72  Temp: 36.8 C  Resp: 16    Post-op Vital Signs: stable   Complications: No apparent anesthesia complications

## 2014-06-16 NOTE — Op Note (Signed)
OPERATIVE REPORT  PREOPERATIVE DIAGNOSIS: Osteoarthritis of the Left hip.   POSTOPERATIVE DIAGNOSIS: Osteoarthritis of the Left  hip.   PROCEDURE: Left total hip arthroplasty, anterior approach.   SURGEON: Gaynelle Arabian, MD   ASSISTANT: Ardeen Jourdain, PA-C  ANESTHESIA:  Spinal  ESTIMATED BLOOD LOSS:-125 ml  DRAINS: Hemovac x1.   COMPLICATIONS: None   CONDITION: PACU - hemodynamically stable.   BRIEF CLINICAL NOTE: [name] is a 80 y.o. male who has advanced end-  stage arthritis of his Left  hip with progressively worsening pain and  dysfunction.The patient has failed nonoperative management and presents for  total hip arthroplasty.   PROCEDURE IN DETAIL: After successful administration of spinal  anesthetic, the traction boots for the [facility] bed were placed on both  feet and the patient was placed onto the [facility] bed, boots placed into the leg  holders. The Left hip was then isolated from the perineum with plastic  drapes and prepped and draped in the usual sterile fashion. ASIS and  greater trochanter were marked and a oblique incision was made, starting  at about 1 cm lateral and 2 cm distal to the ASIS and coursing towards  the anterior cortex of the femur. The skin was cut with a 10 blade  through subcutaneous tissue to the level of the fascia overlying the  tensor fascia lata muscle. The fascia was then incised in line with the  incision at the junction of the anterior third and posterior 2/3rd. The  muscle was teased off the fascia and then the interval between the TFL  and the rectus was developed. The Hohmann retractor was then placed at  the top of the femoral neck over the capsule. The vessels overlying the  capsule were cauterized and the fat on top of the capsule was removed.  A Hohmann retractor was then placed anterior underneath the rectus  femoris to give exposure to the entire anterior capsule. A T-shaped  capsulotomy was performed. The  edges were tagged and the femoral head  was identified.       Osteophytes are removed off the superior acetabulum.  The femoral neck was then cut in situ with an oscillating saw. Traction  was then applied to the left lower extremity utilizing the [facility]  traction. The femoral head was then removed. Retractors were placed  around the acetabulum and then circumferential removal of the labrum was  performed. Osteophytes were also removed. Reaming starts at 47 mm to  medialize and  Increased in 2 mm increments to 53 mm. We reamed in  approximately 40 degrees of abduction, 20 degrees anteversion. A 54 mm  pinnacle acetabular shell was then impacted in anatomic position under  fluoroscopic guidance with excellent purchase. We did not need to place  any additional dome screws. A 36 mm neutral + 4 marathon liner was then  placed into the acetabular shell.       The femoral lift was then placed along the lateral aspect of the femur  just distal to the vastus ridge. The leg was  externally rotated and capsule  was stripped off the inferior aspect of the femoral neck down to the  level of the lesser trochanter, this was done with electrocautery. The femur was lifted after this was performed. The  leg was then placed and extended in adducted position to essentially delivering the femur. We also removed the capsule superiorly and the  piriformis from the piriformis fossa  to gain excellent exposure of the  proximal femur. Rongeur was used to remove some cancellous bone to get  into the lateral portion of the proximal femur for placement of the  initial starter reamer. The starter broaches was placed  the starter broach  and was shown to go down the center of the canal. Broaching  with the  Corail system was then performed starting at size 8, coursing  Up to size 10. A size 10 had excellent torsional and rotational  and axial stability. The trial standard offset neck was then placed  with a 36 + 1.5 trial  head. The hip was then reduced. We confirmed that  the stem was in the canal both on AP and lateral x-rays. It also has excellent sizing. The hip was reduced with outstanding stability through full extension, full external rotation,  and then flexion in adduction internal rotation. AP pelvis was taken  and the leg lengths were measured and found to be exactly equal. Hip  was then dislocated again and the femoral head and neck removed. The  femoral broach was removed. Size 10 Corail stem with a standard offset  neck was then impacted into the femur following native anteversion. Has  excellent purchase in the canal. Excellent torsional and rotational and  axial stability. It is confirmed to be in the canal on AP and lateral  fluoroscopic views. The 36 + 1.5 metal head was placed and the hip  reduced with outstanding stability. Again AP pelvis was taken and it  confirmed that the leg lengths were equal. The wound was then copiously  irrigated with saline solution and the capsule reattached and repaired  with Ethibond suture.  20 mL of Exparel mixed with 50 mL of saline then additional 20 ml of .25% Bupivicaine injected into the capsule and into the edge of the tensor fascia lata as well as subcutaneous tissue. The fascia overlying the tensor fascia lata was  then closed with a running #1 V-Loc. Subcu was closed with interrupted  2-0 Vicryl and subcuticular running 4-0 Monocryl. Incision was cleaned  and dried. Steri-Strips and a bulky sterile dressing applied. Hemovac  drain was hooked to suction and then he was awakened and transported to  recovery in stable condition.        Please note that a surgical assistant was a medical necessity for this procedure to perform it in a safe and expeditious manner. Assistant was necessary to provide appropriate retraction of vital neurovascular structures and to prevent femoral fracture and allow for anatomic placement of the prosthesis.  Gaynelle Arabian, M.D.

## 2014-06-16 NOTE — Progress Notes (Signed)
Utilization review completed.  

## 2014-06-16 NOTE — Transfer of Care (Signed)
Immediate Anesthesia Transfer of Care Note  Patient: [name]  Procedure(s) Performed: Procedure(s): LEFT TOTAL HIP ARTHROPLASTY ANTERIOR APPROACH (Left)  Patient Location: PACU  Anesthesia Type:MAC and Spinal  Level of Consciousness: Patient easily awoken, sedated, comfortable, cooperative, following commands, responds to stimulation.   Airway & Oxygen Therapy: Patient spontaneously breathing, ventilating well, oxygen via simple oxygen mask.  Post-op Assessment: Report given to PACU RN, vital signs reviewed and stable.   Post vital signs: Reviewed and stable.  Complications: No apparent anesthesia complications

## 2014-06-16 NOTE — Anesthesia Procedure Notes (Signed)
Spinal Patient location during procedure: OR Start time: 06/16/2014 11:15 AM End time: 06/16/2014 11:20 AM Staffing Anesthesiologist: Milana Obey Performed by: anesthesiologist  Preanesthetic Checklist Completed: patient identified, site marked, surgical consent, pre-op evaluation, timeout performed, IV checked, risks and benefits discussed and monitors and equipment checked Spinal Block Patient position: sitting Prep: Betadine Patient monitoring: heart rate, continuous pulse ox and blood pressure Location: L3-4 Injection technique: single-shot Needle Needle type: Spinocan  Needle gauge: 22 G Needle length: 9 cm Assessment Sensory level: T8 Additional Notes Expiration date of kit checked and confirmed. Patient tolerated procedure well, without complications.

## 2014-06-16 NOTE — Anesthesia Preprocedure Evaluation (Signed)
Anesthesia Evaluation  Patient identified by MRN, date of birth, ID band Patient awake    Reviewed: Allergy & Precautions, H&P , NPO status , Patient's Chart, lab work & pertinent test results  Airway Mallampati: II  TM Distance: >3 FB Neck ROM: Full    Dental no notable dental hx.    Pulmonary neg pulmonary ROS, former smoker,  breath sounds clear to auscultation  Pulmonary exam normal       Cardiovascular hypertension, Pt. on medications negative cardio ROS  Rhythm:Regular Rate:Normal     Neuro/Psych negative neurological ROS  negative psych ROS   GI/Hepatic negative GI ROS, Neg liver ROS,   Endo/Other  negative endocrine ROS  Renal/GU negative Renal ROS  negative genitourinary   Musculoskeletal negative musculoskeletal ROS (+)   Abdominal   Peds negative pediatric ROS (+)  Hematology negative hematology ROS (+)   Anesthesia Other Findings   Reproductive/Obstetrics negative OB ROS                             Anesthesia Physical Anesthesia Plan  ASA: II  Anesthesia Plan: Spinal   Post-op Pain Management:    Induction:   Airway Management Planned: Simple Face Mask  Additional Equipment:   Intra-op Plan:   Post-operative Plan:   Informed Consent: I have reviewed the patients History and Physical, chart, labs and discussed the procedure including the risks, benefits and alternatives for the proposed anesthesia with the patient or authorized representative who has indicated his/her understanding and acceptance.   Dental advisory given  Plan Discussed with: CRNA  Anesthesia Plan Comments:         Anesthesia Quick Evaluation

## 2014-06-16 NOTE — Interval H&P Note (Signed)
History and Physical Interval Note:  06/16/2014 8:31 AM  [name]  has presented today for surgery, with the diagnosis of left hip osteoarthritis  The various methods of treatment have been discussed with the patient and family. After consideration of risks, benefits and other options for treatment, the patient has consented to  Procedure(s): LEFT TOTAL HIP ARTHROPLASTY ANTERIOR APPROACH (Left) as a surgical intervention .  The patient's history has been reviewed, patient examined, no change in status, stable for surgery.  I have reviewed the patient's chart and labs.  Questions were answered to the patient's satisfaction.     [name]

## 2014-06-17 ENCOUNTER — Encounter (HOSPITAL_COMMUNITY): Payer: Self-pay | Admitting: Orthopedic Surgery

## 2014-06-17 LAB — CBC
HCT: 36.1 % — ABNORMAL LOW (ref 39.0–52.0)
Hemoglobin: 12.2 g/dL — ABNORMAL LOW (ref 13.0–17.0)
MCH: 33.2 pg (ref 26.0–34.0)
MCHC: 33.8 g/dL (ref 30.0–36.0)
MCV: 98.4 fL (ref 78.0–100.0)
PLATELETS: 245 10*3/uL (ref 150–400)
RBC: 3.67 MIL/uL — AB (ref 4.22–5.81)
RDW: 13.6 % (ref 11.5–15.5)
WBC: 11.5 10*3/uL — ABNORMAL HIGH (ref 4.0–10.5)

## 2014-06-17 LAB — BASIC METABOLIC PANEL
ANION GAP: 13 (ref 5–15)
BUN: 15 mg/dL (ref 6–23)
CALCIUM: 8.4 mg/dL (ref 8.4–10.5)
CO2: 22 mEq/L (ref 19–32)
Chloride: 99 mEq/L (ref 96–112)
Creatinine, Ser: 1.22 mg/dL (ref 0.50–1.35)
GFR calc Af Amer: 60 mL/min — ABNORMAL LOW (ref >90)
GFR calc non Af Amer: 52 mL/min — ABNORMAL LOW (ref >90)
Glucose, Bld: 160 mg/dL — ABNORMAL HIGH (ref 70–99)
Potassium: 4.7 mEq/L (ref 3.7–5.3)
Sodium: 134 mEq/L — ABNORMAL LOW (ref 137–147)

## 2014-06-17 MED ORDER — RIVAROXABAN 10 MG PO TABS: 10.0000 mg | ORAL_TABLET | Freq: Every day | ORAL | Status: DC

## 2014-06-17 MED ORDER — METHOCARBAMOL 500 MG PO TABS: 500.0000 mg | ORAL_TABLET | Freq: Four times a day (QID) | ORAL | Status: DC | PRN

## 2014-06-17 MED ORDER — TRAMADOL HCL 50 MG PO TABS: 50.0000 mg | ORAL_TABLET | Freq: Four times a day (QID) | ORAL | Status: DC | PRN

## 2014-06-17 MED ORDER — OXYCODONE HCL 5 MG PO TABS: 5.0000 mg | ORAL_TABLET | ORAL | Status: DC | PRN

## 2014-06-17 NOTE — Evaluation (Signed)
Occupational Therapy Evaluation Patient Details Name: [name] MRN: [id number] DOB: [birth year] Today's Date: 06/17/2014    History of Present Illness s/p L DA THA   Clinical Impression   This 80 year old man was admitted for the above surgery.  All education was completed.  Pt does not need any further OT at this time.    Follow Up Recommendations  No OT follow up;Supervision/Assistance - 24 hour    Equipment Recommendations  None recommended by OT (daughter will get 3:1 commode)    Recommendations for Other Services       Precautions / Restrictions Precautions Precautions: Fall Restrictions Weight Bearing Restrictions: No      Mobility Bed Mobility                  Transfers Overall transfer level: Needs assistance Equipment used: Rolling walker (2 wheeled) Transfers: Sit to/from Stand Sit to Stand: Min guard         General transfer comment: for safety    Balance                                            ADL Overall ADL's : Needs assistance/impaired     Grooming: Supervision/safety;Standing       Lower Body Bathing: Minimal assistance;Sit to/from stand       Lower Body Dressing: Sit to/from stand;Moderate assistance   Toilet Transfer: Min guard;BSC;Ambulation             General ADL Comments: Pt's daughter is visiting and will stay for 3 weeks. She is an OT.  Wife is using 3:1 that pt used after previous sx.  Daughter plans to pick up a used one and get her father a Secondary school teacher.  Wife can assist with ADLs, but she has to be careful when bending down due to dizziness.  Pt will sponge bathe initally--he plans to stay on the first floor.  They believe he will be able to get onto bed without a stool--it is a little high.  Educated to work within pain tolerance and reviewed ADL uses.  Pt used a long shoehorn and reacher to don sock when he had his other hip replaced..  Pt tends to move quickly.  He also tends to elevate  traps when ambulating     Vision                     Perception     Praxis      Pertinent Vitals/Pain Pain Assessment: 0-10 Pain Score: 2  Pain Location: L hip Pain Descriptors / Indicators: Sore Pain Intervention(s): Limited activity within patient's tolerance;Monitored during session;Premedicated before session;Repositioned;Ice applied     Hand Dominance     Extremity/Trunk Assessment Upper Extremity Assessment Upper Extremity Assessment: Overall WFL for tasks assessed           Communication Communication Communication: No difficulties   Cognition Arousal/Alertness: Awake/alert Behavior During Therapy: WFL for tasks assessed/performed Overall Cognitive Status: Within Functional Limits for tasks assessed                     General Comments       Exercises       Shoulder Instructions      Home Living Family/patient expects to be discharged to:: Private residence Living Arrangements: Spouse/significant other Available Help at Discharge: Family  Bathroom Shower/Tub: Corporate investment banker: Handicapped height     Home Equipment: Shower seat          Prior Functioning/Environment Level of Independence: Independent             OT Diagnosis: Generalized weakness   OT Problem List:     OT Treatment/Interventions:      OT Goals(Current goals can be found in the care plan section)    OT Frequency:     Barriers to D/C:            Co-evaluation              End of Session    Activity Tolerance: Patient tolerated treatment well Patient left: in chair;with call bell/phone within reach;with family/visitor present   Time: 1115-1135 OT Time Calculation (min): 20 min Charges:  OT General Charges $OT Visit: 1 Procedure OT Evaluation $Initial OT Evaluation Tier I: 1 Procedure OT Treatments $Self Care/Home Management : 8-22 mins G-Codes:    Laronn Devonshire Jul 11, 2014, 12:22  PM  Lesle Chris, OTR/L [phone] 07-11-14

## 2014-06-17 NOTE — Care Management Note (Signed)
    Page 1 of 2   06/17/2014     1:03:59 PM CARE MANAGEMENT NOTE 06/17/2014  Patient:  [name], [name]   Account Number:  [id number]  Date Initiated:  06/17/2014  Documentation initiated by:  [facility]  Subjective/Objective Assessment:   adm: LEFT TOTAL HIP ARTHROPLASTY ANTERIOR APPROACH (Left)     Action/Plan:   discharge planning   Anticipated DC Date:  06/17/2014   Anticipated DC Plan:  [place]  CM consult      [facility] Choice  HOME HEALTH   Choice offered to / List presented to:  C-1 Patient   DME arranged  NA      DME agency  NA     Bates arranged  HH-2 PT      [place]   Status of service:  Completed, signed off Medicare Important Message given?   (If response is "NO", the following Medicare IM given date fields will be blank) Date Medicare IM given:   Medicare IM given by:   Date Additional Medicare IM given:   Additional Medicare IM given by:    Discharge Disposition:  [place]  Per UR Regulation:    If discussed at Long Length of Stay Meetings, dates discussed:    Comments:  06/17/14 13:00 Cm met with pt in room to offer choice of home health agency.  Pt chooses [facility] to render HHPT.  Pt has had similar surgery in 2013 and has 3n1 and rolling walker at home.  Address and contact information verified with pt.  Referral called to [facility].  No other CM needs were communicated.  Mariane Masters, BSN, CM [phone].

## 2014-06-17 NOTE — Discharge Summary (Signed)
Physician Discharge Summary   Patient ID: [name] MRN: [id number] DOB/AGE: [birth year] 78 y.o.  Admit date: 06/16/2014 Discharge date: 06/18/2014  Primary Diagnosis:  Osteoarthritis of the Left hip. Admission Diagnoses:  Past Medical History  Diagnosis Date  . Hypertension   . Cancer     thyroid  . Hypercholesterolemia   . Biceps tendon rupture     hx of  . Capsulitis of shoulder     hx of    Discharge Diagnoses:   Principal Problem:   OA (osteoarthritis) of hip  Estimated body mass index is 23.63 kg/(m^2) as calculated from the following:   Height as of this encounter: '5\' 5"'  (1.651 m).   Weight as of this encounter: 64.411 kg (142 lb).  Procedure(s) (LRB): LEFT TOTAL HIP ARTHROPLASTY ANTERIOR APPROACH (Left)   Consults: None  HPI: [name] is a 80 y.o. male who has advanced end-  stage arthritis of his Left hip with progressively worsening pain and  dysfunction.The patient has failed nonoperative management and presents for  total hip arthroplasty.  Laboratory Data: Admission on 06/16/2014, Discharged on 06/18/2014  Component Date Value Ref Range Status  . WBC 06/17/2014 11.5* 4.0 - 10.5 K/uL Final  . RBC 06/17/2014 3.67* 4.22 - 5.81 MIL/uL Final  . Hemoglobin 06/17/2014 12.2* 13.0 - 17.0 g/dL Final  . HCT 06/17/2014 36.1* 39.0 - 52.0 % Final  . MCV 06/17/2014 98.4  78.0 - 100.0 fL Final  . MCH 06/17/2014 33.2  26.0 - 34.0 pg Final  . MCHC 06/17/2014 33.8  30.0 - 36.0 g/dL Final  . RDW 06/17/2014 13.6  11.5 - 15.5 % Final  . Platelets 06/17/2014 245  150 - 400 K/uL Final  . Sodium 06/17/2014 134* 137 - 147 mEq/L Final  . Potassium 06/17/2014 4.7  3.7 - 5.3 mEq/L Final  . Chloride 06/17/2014 99  96 - 112 mEq/L Final  . CO2 06/17/2014 22  19 - 32 mEq/L Final  . Glucose, Bld 06/17/2014 160* 70 - 99 mg/dL Final  . BUN 06/17/2014 15  6 - 23 mg/dL Final  . Creatinine, Ser 06/17/2014 1.22  0.50 - 1.35 mg/dL Final  . Calcium 06/17/2014 8.4  8.4 -  10.5 mg/dL Final  . GFR calc non Af Amer 06/17/2014 52* >90 mL/min Final  . GFR calc Af Amer 06/17/2014 60* >90 mL/min Final   Comment: (NOTE) The eGFR has been calculated using the CKD EPI equation. This calculation has not been validated in all clinical situations. eGFR's persistently <90 mL/min signify possible Chronic Kidney Disease.   . Anion gap 06/17/2014 13  5 - 15 Final  . WBC 06/18/2014 13.2* 4.0 - 10.5 K/uL Final  . RBC 06/18/2014 3.80* 4.22 - 5.81 MIL/uL Final  . Hemoglobin 06/18/2014 12.5* 13.0 - 17.0 g/dL Final  . HCT 06/18/2014 37.2* 39.0 - 52.0 % Final  . MCV 06/18/2014 97.9  78.0 - 100.0 fL Final  . MCH 06/18/2014 32.9  26.0 - 34.0 pg Final  . MCHC 06/18/2014 33.6  30.0 - 36.0 g/dL Final  . RDW 06/18/2014 13.7  11.5 - 15.5 % Final  . Platelets 06/18/2014 240  150 - 400 K/uL Final  . Sodium 06/18/2014 136* 137 - 147 mEq/L Final  . Potassium 06/18/2014 4.5  3.7 - 5.3 mEq/L Final  . Chloride 06/18/2014 101  96 - 112 mEq/L Final  . CO2 06/18/2014 24  19 - 32 mEq/L Final  . Glucose, Bld 06/18/2014 117* 70 - 99 mg/dL Final  .  BUN 06/18/2014 21  6 - 23 mg/dL Final  . Creatinine, Ser 06/18/2014 1.23  0.50 - 1.35 mg/dL Final  . Calcium 06/18/2014 8.4  8.4 - 10.5 mg/dL Final  . GFR calc non Af Amer 06/18/2014 51* >90 mL/min Final  . GFR calc Af Amer 06/18/2014 59* >90 mL/min Final   Comment: (NOTE) The eGFR has been calculated using the CKD EPI equation. This calculation has not been validated in all clinical situations. eGFR's persistently <90 mL/min signify possible Chronic Kidney Disease.   Georgiann Hahn gap 06/18/2014 11  5 - 15 Final  Hospital Outpatient Visit on 06/10/2014  Component Date Value Ref Range Status  . MRSA, PCR 06/10/2014 NEGATIVE  NEGATIVE Final  . Staphylococcus aureus 06/10/2014 NEGATIVE  NEGATIVE Final   Comment:        The Xpert SA Assay (FDA approved for NASAL specimens in patients over 34 years of age), is one component of a comprehensive  surveillance program.  Test performance has been validated by [employer] for patients greater than or equal to 25 year old. It is not intended to diagnose infection nor to guide or monitor treatment.   Marland Kitchen aPTT 06/10/2014 30  24 - 37 seconds Final  . WBC 06/10/2014 6.4  4.0 - 10.5 K/uL Final  . RBC 06/10/2014 4.54  4.22 - 5.81 MIL/uL Final  . Hemoglobin 06/10/2014 14.9  13.0 - 17.0 g/dL Final  . HCT 06/10/2014 45.0  39.0 - 52.0 % Final  . MCV 06/10/2014 99.1  78.0 - 100.0 fL Final  . MCH 06/10/2014 32.8  26.0 - 34.0 pg Final  . MCHC 06/10/2014 33.1  30.0 - 36.0 g/dL Final  . RDW 06/10/2014 13.7  11.5 - 15.5 % Final  . Platelets 06/10/2014 241  150 - 400 K/uL Final  . Sodium 06/10/2014 140  137 - 147 mEq/L Final  . Potassium 06/10/2014 5.1  3.7 - 5.3 mEq/L Final  . Chloride 06/10/2014 102  96 - 112 mEq/L Final  . CO2 06/10/2014 26  19 - 32 mEq/L Final  . Glucose, Bld 06/10/2014 93  70 - 99 mg/dL Final  . BUN 06/10/2014 19  6 - 23 mg/dL Final  . Creatinine, Ser 06/10/2014 1.36* 0.50 - 1.35 mg/dL Final  . Calcium 06/10/2014 9.7  8.4 - 10.5 mg/dL Final  . Total Protein 06/10/2014 7.3  6.0 - 8.3 g/dL Final  . Albumin 06/10/2014 3.6  3.5 - 5.2 g/dL Final  . AST 06/10/2014 25  0 - 37 U/L Final  . ALT 06/10/2014 18  0 - 53 U/L Final  . Alkaline Phosphatase 06/10/2014 61  39 - 117 U/L Final  . Total Bilirubin 06/10/2014 0.9  0.3 - 1.2 mg/dL Final  . GFR calc non Af Amer 06/10/2014 45* >90 mL/min Final  . GFR calc Af Amer 06/10/2014 53* >90 mL/min Final   Comment: (NOTE) The eGFR has been calculated using the CKD EPI equation. This calculation has not been validated in all clinical situations. eGFR's persistently <90 mL/min signify possible Chronic Kidney Disease.   . Anion gap 06/10/2014 12  5 - 15 Final  . Prothrombin Time 06/10/2014 13.6  11.6 - 15.2 seconds Final  . INR 06/10/2014 1.03  0.00 - 1.49 Final  . Color, Urine 06/10/2014 YELLOW  YELLOW Final  . APPearance 06/10/2014  CLEAR  CLEAR Final  . Specific Gravity, Urine 06/10/2014 1.019  1.005 - 1.030 Final  . pH 06/10/2014 6.0  5.0 - 8.0 Final  . Glucose,  UA 06/10/2014 NEGATIVE  NEGATIVE mg/dL Final  . Hgb urine dipstick 06/10/2014 NEGATIVE  NEGATIVE Final  . Bilirubin Urine 06/10/2014 NEGATIVE  NEGATIVE Final  . Ketones, ur 06/10/2014 NEGATIVE  NEGATIVE mg/dL Final  . Protein, ur 06/10/2014 NEGATIVE  NEGATIVE mg/dL Final  . Urobilinogen, UA 06/10/2014 0.2  0.0 - 1.0 mg/dL Final  . Nitrite 06/10/2014 NEGATIVE  NEGATIVE Final  . Leukocytes, UA 06/10/2014 NEGATIVE  NEGATIVE Final   MICROSCOPIC NOT DONE ON URINES WITH NEGATIVE PROTEIN, BLOOD, LEUKOCYTES, NITRITE, OR GLUCOSE <1000 mg/dL.  . ABO/RH(D) 06/10/2014 A POS   Final  . Antibody Screen 06/10/2014 NEG   Final  . Sample Expiration 06/10/2014 06/19/2014   Final     X-Rays:Dg Pelvis Portable  06/16/2014   CLINICAL DATA:  Postop for left hip replacement.  EXAM: PORTABLE PELVIS 1-2 VIEWS  COMPARISON:  06/10/2014  FINDINGS: A left hip arthroplasty has been performed. There is a surgical drain. Lucency in the left upper thigh soft tissues are consistent with expected postoperative changes. Again noted is a right hip replacement without complicating features. Bony pelvic ring is intact. Multiple pelvic calcifications.  IMPRESSION: Left hip arthroplasty.  No complicating features.   Electronically Signed   By: Markus Daft M.D.   On: 06/16/2014 13:59   Dg C-arm 1-60 Min-no Report  06/16/2014   CLINICAL DATA: Surgery   C-ARM 1-60 MINUTES  Fluoroscopy was utilized by the requesting physician.  No radiographic  interpretation.     EKG: Orders placed or performed during the hospital encounter of 06/10/14  . EKG 12-Lead  . EKG 12-Lead     Hospital Course: Patient was admitted to [facility] and taken to the OR and underwent the above state procedure without complications.  Patient tolerated the procedure well and was later transferred to the recovery room  and then to the orthopaedic floor for postoperative care.  They were given PO and IV analgesics for pain control following their surgery.  They were given 24 hours of postoperative antibiotics of  Anti-infectives    Start     Dose/Rate Route Frequency Ordered Stop   06/16/14 1800  ceFAZolin (ANCEF) IVPB 2 g/50 mL premix     2 g100 mL/hr over 30 Minutes Intravenous Every 6 hours 06/16/14 1541 06/17/14 0108   06/16/14 0756  ceFAZolin (ANCEF) IVPB 2 g/50 mL premix     2 g100 mL/hr over 30 Minutes Intravenous On call to O.R. 06/16/14 0756 06/16/14 1109     and started on DVT prophylaxis in the form of Xarelto.   PT and OT were ordered for total hip protocol.  The patient was allowed to be WBAT with therapy. Discharge planning was consulted to help with postop disposition and equipment needs.  Patient had a good night on the evening of surgery.  They started to get up OOB with therapy on day one.  Hemovac drain was pulled without difficulty.  Continued to work with therapy into day two.  Dressing was changed on day two and the incision was healing well.  Patient was seen in rounds and was ready to go home.  Discharge home with home health Diet - Cardiac diet Follow up - in 2 weeks Activity - WBAT Disposition - Home Condition Upon Discharge - Good D/C Meds - See DC Summary DVT Prophylaxis - Xarelto      Discharge Instructions    Call MD / Call 911    Complete by:  As directed   If you experience  chest pain or shortness of breath, CALL 911 and be transported to the hospital emergency room.  If you develope a fever above 101 F, pus (white drainage) or increased drainage or redness at the wound, or calf pain, call your surgeon's office.     Change dressing    Complete by:  As directed   You may change your dressing dressing daily with sterile 4 x 4 inch gauze dressing and paper tape.  Do not submerge the incision under water.     Constipation Prevention    Complete by:  As directed   Drink plenty  of fluids.  Prune juice may be helpful.  You may use a stool softener, such as Colace (over the counter) 100 mg twice a day.  Use MiraLax (over the counter) for constipation as needed.     Diet general    Complete by:  As directed      Discharge instructions    Complete by:  As directed   Pick up stool softner and laxative for home use following surgery while on pain medications. Do not submerge incision under water. Please use good hand washing techniques while changing dressing each day. May shower starting three days after surgery. Please use a clean towel to pat the incision dry following showers. Continue to use ice for pain and swelling after surgery. Do not use any lotions or creams on the incision until instructed by your surgeon.  Total Hip Protocol.  Take Xarelto for two and a half more weeks, then discontinue Xarelto. Once the patient has completed the Xarelto, they may resume the 81 mg Aspirin.  Postoperative Constipation Protocol  Constipation - defined medically as fewer than three stools per week and severe constipation as less than one stool per week.  One of the most common issues patients have following surgery is constipation.  Even if you have a regular bowel pattern at home, your normal regimen is likely to be disrupted due to multiple reasons following surgery.  Combination of anesthesia, postoperative narcotics, change in appetite and fluid intake all can affect your bowels.  In order to avoid complications following surgery, here are some recommendations in order to help you during your recovery period.  Colace (docusate) - Pick up an over-the-counter form of Colace or another stool softener and take twice a day as long as you are requiring postoperative pain medications.  Take with a full glass of water daily.  If you experience loose stools or diarrhea, hold the colace until you stool forms back up.  If your symptoms do not get better within 1 week or if they get  worse, check with your doctor.  Dulcolax (bisacodyl) - Pick up over-the-counter and take as directed by the product packaging as needed to assist with the movement of your bowels.  Take with a full glass of water.  Use this product as needed if not relieved by Colace only.   MiraLax (polyethylene glycol) - Pick up over-the-counter to have on hand.  MiraLax is a solution that will increase the amount of water in your bowels to assist with bowel movements.  Take as directed and can mix with a glass of water, juice, soda, coffee, or tea.  Take if you go more than two days without a movement. Do not use MiraLax more than once per day. Call your doctor if you are still constipated or irregular after using this medication for 7 days in a row.  If you continue to have problems with  postoperative constipation, please contact the office for further assistance and recommendations.  If you experience "the worst abdominal pain ever" or develop nausea or vomiting, please contact the office immediatly for further recommendations for treatment.     Do not sit on low chairs, stoools or toilet seats, as it may be difficult to get up from low surfaces    Complete by:  As directed      Driving restrictions    Complete by:  As directed   No driving until released by the physician.     Increase activity slowly as tolerated    Complete by:  As directed      Lifting restrictions    Complete by:  As directed   No lifting until released by the physician.     Patient may shower    Complete by:  As directed   You may shower without a dressing once there is no drainage.  Do not wash over the wound.  If drainage remains, do not shower until drainage stops.     TED hose    Complete by:  As directed   Use stockings (TED hose) for 3 weeks on both leg(s).  You may remove them at night for sleeping.     Weight bearing as tolerated    Complete by:  As directed             Medication List    STOP taking these  medications        aspirin EC 81 MG tablet     FISH OIL PO     multivitamin with minerals Tabs tablet     OVER THE COUNTER MEDICATION     POLICOSANOL PO      TAKE these medications        acetaminophen 500 MG tablet  Commonly known as:  TYLENOL  Take 500 mg by mouth every 6 (six) hours as needed for moderate pain or headache.     levothyroxine 75 MCG tablet  Commonly known as:  SYNTHROID, LEVOTHROID  Take 75 mcg by mouth every morning.     lisinopril 10 MG tablet  Commonly known as:  PRINIVIL,ZESTRIL  Take 10 mg by mouth at bedtime.     methocarbamol 500 MG tablet  Commonly known as:  ROBAXIN  Take 1 tablet (500 mg total) by mouth every 6 (six) hours as needed for muscle spasms.     oxyCODONE 5 MG immediate release tablet  Commonly known as:  Oxy IR/ROXICODONE  Take 1-2 tablets (5-10 mg total) by mouth every 3 (three) hours as needed for moderate pain, severe pain or breakthrough pain.     rivaroxaban 10 MG Tabs tablet  Commonly known as:  XARELTO  - Take 1 tablet (10 mg total) by mouth daily with breakfast. Take Xarelto for two and a half more weeks, then discontinue Xarelto.  - Once the patient has completed the Xarelto, they may resume the 81 mg Aspirin.     tamsulosin 0.4 MG Caps capsule  Commonly known as:  FLOMAX  Take 0.4 mg by mouth at bedtime.     traMADol 50 MG tablet  Commonly known as:  ULTRAM  Take 1-2 tablets (50-100 mg total) by mouth every 6 (six) hours as needed (mild pain).     Zinc 50 MG Tabs  Take 1 tablet by mouth at bedtime.       Follow-up Information    Follow up with [facility].   Why:  home health  physical therapy   Contact information:   [address] [place] [address] [phone]       Follow up with [name], MD In 2 weeks.   Specialty:  Orthopedic Surgery   Why:  Call office at [phone] to setup appointment with Dr. Wynelle Link the last week of December.   Contact information:   [address] [place] [address] [phone]       Signed: Arlee Muslim, PA-C Orthopaedic Surgery 07/15/2014, 9:29 AM

## 2014-06-17 NOTE — Progress Notes (Signed)
Physical Therapy Treatment Note    06/17/14 1500  PT Visit Information  Last PT Received On 06/17/14  Assistance Needed +1  History of Present Illness Pt is an 80 year old male s/p L direct anterior THA with hx of R THA  PT Time Calculation  PT Start Time (ACUTE ONLY) 1419  PT Stop Time (ACUTE ONLY) 1430  PT Time Calculation (min) (ACUTE ONLY) 11 min  Subjective Data  Subjective Pt ambulated in hallway and practiced steps.    Precautions  Precautions Fall  Restrictions  Other Position/Activity Restrictions WBAT  Pain Assessment  Pain Assessment 0-10  Pain Score 2  Pain Location L hip  Pain Descriptors / Indicators Aching;Sore  Pain Intervention(s) Limited activity within patient's tolerance;Monitored during session;Repositioned  Cognition  Arousal/Alertness Awake/alert  Behavior During Therapy WFL for tasks assessed/performed  Overall Cognitive Status Within Functional Limits for tasks assessed  Bed Mobility  Overal bed mobility Needs Assistance  Bed Mobility Supine to Sit  Supine to sit Supervision  Transfers  Overall transfer level Needs assistance  Equipment used Rolling walker (2 wheeled)  Transfers Sit to/from Stand  Sit to Stand Min guard  General transfer comment verbal cues for hand placement  Ambulation/Gait  Ambulation/Gait assistance Min guard  Ambulation Distance (Feet) 240 Feet  Assistive device Rolling walker (2 wheeled)  Gait Pattern/deviations Step-through pattern;Antalgic  General Gait Details verbal cues for RW positioning, posture  Stairs Yes  Stairs assistance Min guard  Stair Management Step to pattern;Backwards;Forwards;With walker;Two rails  Number of Stairs 2  General stair comments verbal cues for safety and technique for backwards with RW and forwards with rails  PT - End of Session  Activity Tolerance Patient tolerated treatment well  Patient left in chair;with call bell/phone within reach;with family/visitor present  PT - Assessment/Plan   PT Plan Current plan remains appropriate  PT Frequency (ACUTE ONLY) 7X/week  Follow Up Recommendations Home health PT  PT equipment None recommended by PT  PT Goal Progression  Progress towards PT goals Progressing toward goals  PT General Charges  $$ ACUTE PT VISIT 1 Procedure  PT Treatments  $Gait Training 8-22 mins   Carmelia Bake, PT, DPT 06/17/2014 Pager: [phone]

## 2014-06-17 NOTE — Discharge Instructions (Addendum)
Dr. Gaynelle Arabian Total Joint Specialist [facility] [address] [place],  [address] [phone]    ANTERIOR APPROACH TOTAL HIP REPLACEMENT POSTOPERATIVE DIRECTIONS   Hip Rehabilitation, Guidelines Following Surgery  The results of a hip operation are greatly improved after range of motion and muscle strengthening exercises. Follow all safety measures which are given to protect your hip. If any of these exercises cause increased pain or swelling in your joint, decrease the amount until you are comfortable again. Then slowly increase the exercises. Call your caregiver if you have problems or questions.  HOME CARE INSTRUCTIONS  Most of the following instructions are designed to prevent the dislocation of your new hip.  Remove items at home which could result in a fall. This includes throw rugs or furniture in walking pathways.  Continue medications as instructed at time of discharge.  You may have some home medications which will be placed on hold until you complete the course of blood thinner medication.  You may start showering once you are discharged home but do not submerge the incision under water. Just pat the incision dry and apply a dry gauze dressing on daily. Do not put on socks or shoes without following the instructions of your caregivers.  Sit on high chairs which makes it easier to stand.  Sit on chairs with arms. Use the chair arms to help push yourself up when arising.  Keep your leg on the side of the operation out in front of you when standing up.  Arrange for the use of a toilet seat elevator so you are not sitting low.    Walk with walker as instructed.  You may resume a sexual relationship in one month or when given the OK by your caregiver.  Use walker as long as suggested by your caregivers.  You may put full weight on your legs and walk as much as is comfortable. Avoid periods of inactivity such as sitting longer than an hour  when not asleep. This helps prevent blood clots.  You may return to work once you are cleared by Engineer, production.  Do not drive a car for 6 weeks or until released by your surgeon.  Do not drive while taking narcotics.  Wear elastic stockings for three weeks following surgery during the day but you may remove then at night.  Make sure you keep all of your appointments after your operation with all of your doctors and caregivers. You should call the office at the above phone number and make an appointment for approximately two weeks after the date of your surgery. Change the dressing daily and reapply a dry dressing each time. Please pick up a stool softener and laxative for home use as long as you are requiring pain medications.  ICE to the affected hip every three hours for 30 minutes at a time and then as needed for pain and swelling.  Continue to use ice on the hip for pain and swelling from surgery. You may notice swelling that will progress down to the foot and ankle.  This is normal after surgery.  Elevate the leg when you are not up walking on it.   It is important for you to complete the blood thinner medication as prescribed by your doctor.  Continue to use the breathing machine which will help keep your temperature down.  It is common for your temperature to cycle up and down following surgery, especially at night when you are not up moving around  and exerting yourself.  The breathing machine keeps your lungs expanded and your temperature down.  RANGE OF MOTION AND STRENGTHENING EXERCISES  These exercises are designed to help you keep full movement of your hip joint. Follow your caregiver's or physical therapist's instructions. Perform all exercises about fifteen times, three times per day or as directed. Exercise both hips, even if you have had only one joint replacement. These exercises can be done on a training (exercise) mat, on the floor, on a table or on a bed. Use whatever works the best  and is most comfortable for you. Use music or television while you are exercising so that the exercises are a pleasant break in your day. This will make your life better with the exercises acting as a break in routine you can look forward to.  Lying on your back, slowly slide your foot toward your buttocks, raising your knee up off the floor. Then slowly slide your foot back down until your leg is straight again.  Lying on your back spread your legs as far apart as you can without causing discomfort.  Lying on your side, raise your upper leg and foot straight up from the floor as far as is comfortable. Slowly lower the leg and repeat.  Lying on your back, tighten up the muscle in the front of your thigh (quadriceps muscles). You can do this by keeping your leg straight and trying to raise your heel off the floor. This helps strengthen the largest muscle supporting your knee.  Lying on your back, tighten up the muscles of your buttocks both with the legs straight and with the knee bent at a comfortable angle while keeping your heel on the floor.   SKILLED REHAB INSTRUCTIONS: If the patient is transferred to a skilled rehab facility following release from the hospital, a list of the current medications will be sent to the facility for the patient to continue.  When discharged from the skilled rehab facility, please have the facility set up the patient's [place] prior to being released. Also, the skilled facility will be responsible for providing the patient with their medications at time of release from the facility to include their pain medication, the muscle relaxants, and their blood thinner medication. If the patient is still at the rehab facility at time of the two week follow up appointment, the skilled rehab facility will also need to assist the patient in arranging follow up appointment in our office and any transportation needs.  MAKE SURE YOU:  Understand these instructions.    Will watch your condition.  Will get help right away if you are not doing well or get worse.  Pick up stool softner and laxative for home use following surgery while on pain medications. Do not submerge incision under water. Please use good hand washing techniques while changing dressing each day. May shower starting three days after surgery. Please use a clean towel to pat the incision dry following showers. Continue to use ice for pain and swelling after surgery. Do not use any lotions or creams on the incision until instructed by your surgeon. Total Hip Protocol.  Take Xarelto for two and a half more weeks, then discontinue Xarelto. Once the patient has completed the Xarelto, they may resume the 81 mg Aspirin.   Postoperative Constipation Protocol  Constipation - defined medically as fewer than three stools per week and severe constipation as less than one stool per week.  One of the most common issues  have following surgery is constipation.  Even if you have a regular bowel pattern at home, your normal regimen is likely to be disrupted due to multiple reasons following surgery.  Combination of anesthesia, postoperative narcotics, change in appetite and fluid intake all can affect your bowels.  In order to avoid complications following surgery, here are some recommendations in order to help you during your recovery period. ° °Colace (docusate) - Pick up an over-the-counter form of Colace or another stool softener and take twice a day as long as you are requiring postoperative pain medications.  Take with a full glass of water daily.  If you experience loose stools or diarrhea, hold the colace until you stool forms back up.  If your symptoms do not get better within 1 week or if they get worse, check with your doctor. ° °Dulcolax (bisacodyl) - Pick up over-the-counter and take as directed by the product packaging as needed to assist with the movement of your bowels.  Take with a full  glass of water.  Use this product as needed if not relieved by Colace only.  ° °MiraLax (polyethylene glycol) - Pick up over-the-counter to have on hand.  MiraLax is a solution that will increase the amount of water in your bowels to assist with bowel movements.  Take as directed and can mix with a glass of water, juice, soda, coffee, or tea.  Take if you go more than two days without a movement. °Do not use MiraLax more than once per day. Call your doctor if you are still constipated or irregular after using this medication for 7 days in a row. ° °If you continue to have problems with postoperative constipation, please contact the office for further assistance and recommendations.  If you experience "the worst abdominal pain ever" or develop nausea or vomiting, please contact the office immediatly for further recommendations for treatment. ° ° ° °Information on my medicine - XARELTO® (Rivaroxaban) ° °This medication education was reviewed with me or my healthcare representative as part of my discharge preparation.  The pharmacist that spoke with me during my hospital stay was:  Zeigler, Dustin George, RPH ° °Why was Xarelto® prescribed for you? °Xarelto® was prescribed for you to reduce the risk of blood clots forming after orthopedic surgery. The medical term for these abnormal blood clots is venous thromboembolism (VTE). ° °What do you need to know about xarelto® ? °Take your Xarelto® ONCE DAILY at the same time every day. °You may take it either with or without food. ° °If you have difficulty swallowing the tablet whole, you may crush it and mix in applesauce just prior to taking your dose. ° °Take Xarelto® exactly as prescribed by your doctor and DO NOT stop taking Xarelto® without talking to the doctor who prescribed the medication.  Stopping without other VTE prevention medication to take the place of Xarelto® may increase your risk of developing a clot. ° °After discharge, you should have regular check-up  appointments with your healthcare provider that is prescribing your Xarelto®.   ° °What do you do if you miss a dose? °If you miss a dose, take it as soon as you remember on the same day then continue your regularly scheduled once daily regimen the next day. Do not take two doses of Xarelto® on the same day.  ° °Important Safety Information °A possible side effect of Xarelto® is bleeding. You should call your healthcare provider right away if you experience any of the following: °? Bleeding   from an injury or your nose that does not stop. °? Unusual colored urine (red or dark brown) or unusual colored stools (red or black). °? Unusual bruising for unknown reasons. °? A serious fall or if you hit your head (even if there is no bleeding). ° °Some medicines may interact with Xarelto® and might increase your risk of bleeding while on Xarelto®. To help avoid this, consult your healthcare provider or pharmacist prior to using any new prescription or non-prescription medications, including herbals, vitamins, non-steroidal anti-inflammatory drugs (NSAIDs) and supplements. ° °This website has more information on Xarelto®: www.xarelto.com. ° ° ° °

## 2014-06-17 NOTE — Evaluation (Addendum)
Physical Therapy Evaluation Patient Details Name: [name] MRN: [id number] DOB: May 06, 1926 Today's Date: 06/17/2014   History of Present Illness  Pt is an 80 year old male s/p L direct anterior THA with hx of R THA  Clinical Impression  Pt is s/p L THA resulting in the deficits listed below (see PT Problem List).  Pt will benefit from skilled PT to increase their independence and safety with mobility to allow discharge to the venue listed below.  Pt mobilizing very well POD #1 and plans to d/c home with assist from family.      Follow Up Recommendations Home health PT    Equipment Recommendations  None recommended by PT    Recommendations for Other Services       Precautions / Restrictions Precautions Precautions: Fall Restrictions Weight Bearing Restrictions: No Other Position/Activity Restrictions: WBAT      Mobility  Bed Mobility Overal bed mobility: Needs Assistance Bed Mobility: Supine to Sit     Supine to sit: Supervision     General bed mobility comments: verbal cues for self assist  Transfers Overall transfer level: Needs assistance Equipment used: Rolling walker (2 wheeled) Transfers: Sit to/from Stand Sit to Stand: Min guard         General transfer comment: verbal cues for hand placement  Ambulation/Gait Ambulation/Gait assistance: Min guard Ambulation Distance (Feet): 160 Feet Assistive device: Rolling walker (2 wheeled) Gait Pattern/deviations: Step-through pattern;Step-to pattern;Antalgic     General Gait Details: verbal cues for sequence, RW positioning, posture, pt able to progress to step through pattern  Stairs            Wheelchair Mobility    Modified Rankin (Stroke Patients Only)       Balance                                             Pertinent Vitals/Pain Pain Assessment: 0-10 Pain Score: 2  Pain Location: L hip Pain Descriptors / Indicators: Sore Pain Intervention(s): Limited activity  within patient's tolerance;Monitored during session;Premedicated before session;Repositioned;Ice applied    Home Living Family/patient expects to be discharged to:: Private residence Living Arrangements: Spouse/significant other Available Help at Discharge: Family;Available 24 hours/day Type of Home: House Home Access: Stairs to enter Entrance Stairs-Rails: Right Entrance Stairs-Number of Steps: 4 Home Layout: Able to live on main level with bedroom/bathroom;Two level Home Equipment: Clinical cytogeneticist - 2 wheels      Prior Function Level of Independence: Independent               Hand Dominance        Extremity/Trunk Assessment   Upper Extremity Assessment: Overall WFL for tasks assessed           Lower Extremity Assessment: LLE deficits/detail   LLE Deficits / Details: decreased active ROM due to surgical pain and weakness      Communication   Communication: No difficulties  Cognition Arousal/Alertness: Awake/alert Behavior During Therapy: WFL for tasks assessed/performed Overall Cognitive Status: Within Functional Limits for tasks assessed                      General Comments      Exercises Total Joint Exercises Quad Sets: AROM;Both;15 reps Hip ABduction/ADduction: AROM;Supine;Standing;Left;15 reps;10 reps Straight Leg Raises: AROM;Left;5 reps;Supine Long Arc Quad: AROM;Seated;Left;15 reps Marching in Standing: AROM;Seated;Standing;Left;15 reps Standing Hip Extension: AROM;Left;10  reps;Standing General Exercises - Lower Extremity Heel Raises: AROM;Both;15 reps;Standing Other Exercises Other Exercises: standing exercises performed with UE support      Assessment/Plan    PT Assessment Patient needs continued PT services  PT Diagnosis Difficulty walking;Acute pain   PT Problem List Decreased strength;Decreased mobility;Pain  PT Treatment Interventions Functional mobility training;Stair training;Gait training;Patient/family  education;Therapeutic activities;Therapeutic exercise;DME instruction   PT Goals (Current goals can be found in the Care Plan section) Acute Rehab PT Goals PT Goal Formulation: With patient Time For Goal Achievement: 06/21/14 Potential to Achieve Goals: Good    Frequency 7X/week   Barriers to discharge        Co-evaluation               End of Session   Activity Tolerance: Patient tolerated treatment well Patient left: in chair;with call bell/phone within reach;with family/visitor present           Time: 4718-5501 PT Time Calculation (min) (ACUTE ONLY): 25 min   Charges:   PT Evaluation $Initial PT Evaluation Tier I: 1 Procedure PT Treatments $Gait Training: 8-22 mins $Therapeutic Exercise: 8-22 mins   PT G Codes:          Therman Hughlett,KATHrine E 06/17/2014, 12:55 PM Carmelia Bake, PT, DPT 06/17/2014 Pager: [phone]

## 2014-06-17 NOTE — Progress Notes (Signed)
   Subjective: 1 Day Post-Op Procedure(s) (LRB): LEFT TOTAL HIP ARTHROPLASTY ANTERIOR APPROACH (Left) Patient reports pain as mild.   Patient seen in rounds for Dr. Wynelle Link.  Daughter Chrys Racer in room with patient. Patient is well, but has had some minor complaints of pain in the hip, requiring pain medications We will start therapy today. Sat up on side of bed on evening of surgery. Plan is to go hoem with [facility] after hospital stay.  Objective: Vital signs in last 24 hours: Temp:  [97.3 F (36.3 C)-98.5 F (36.9 C)] 97.7 F (36.5 C) (12/15 0551) Pulse Rate:  [65-88] 65 (12/15 0551) Resp:  [14-21] 18 (12/15 0551) BP: (104-136)/(50-74) 104/56 mmHg (12/15 0551) SpO2:  [95 %-100 %] 95 % (12/15 0551) Weight:  [64.411 kg (142 lb)] 64.411 kg (142 lb) (12/14 1515)  Intake/Output from previous day:  Intake/Output Summary (Last 24 hours) at 06/17/14 0816 Last data filed at 06/17/14 0559  Gross per 24 hour  Intake   3995 ml  Output   2262 ml  Net   1733 ml      Labs:  Recent Labs  06/17/14 0411  HGB 12.2*    Recent Labs  06/17/14 0411  WBC 11.5*  RBC 3.67*  HCT 36.1*  PLT 245    Recent Labs  06/17/14 0411  NA 134*  K 4.7  CL 99  CO2 22  BUN 15  CREATININE 1.22  GLUCOSE 160*  CALCIUM 8.4   No results for input(s): LABPT, INR in the last 72 hours.  EXAM General - Patient is Alert, Appropriate and Oriented Extremity - Neurovascular intact Sensation intact distally Dorsiflexion/Plantar flexion intact Dressing - dressing C/D/I Motor Function - intact, moving foot and toes well on exam.  Hemovac pulled without difficulty.  Past Medical History  Diagnosis Date  . Hypertension   . Cancer     thyroid  . Hypercholesterolemia   . Biceps tendon rupture     hx of  . Capsulitis of shoulder     hx of     Assessment/Plan: 1 Day Post-Op Procedure(s) (LRB): LEFT TOTAL HIP ARTHROPLASTY ANTERIOR APPROACH (Left) Principal Problem:   OA (osteoarthritis) of  hip  Estimated body mass index is 23.63 kg/(m^2) as calculated from the following:   Height as of this encounter: 5\' 5"  (1.651 m).   Weight as of this encounter: 64.411 kg (142 lb). Up with therapy Plan for discharge tomorrow Discharge home with home health  DVT Prophylaxis - Xarelto Weight Bearing As Tolerated left Leg Hemovac Pulled Begin Therapy   Arlee Muslim, PA-C Orthopaedic Surgery 06/17/2014, 8:16 AM

## 2014-06-18 LAB — BASIC METABOLIC PANEL
ANION GAP: 11 (ref 5–15)
BUN: 21 mg/dL (ref 6–23)
CO2: 24 mEq/L (ref 19–32)
Calcium: 8.4 mg/dL (ref 8.4–10.5)
Chloride: 101 mEq/L (ref 96–112)
Creatinine, Ser: 1.23 mg/dL (ref 0.50–1.35)
GFR calc non Af Amer: 51 mL/min — ABNORMAL LOW (ref >90)
GFR, EST AFRICAN AMERICAN: 59 mL/min — AB (ref >90)
Glucose, Bld: 117 mg/dL — ABNORMAL HIGH (ref 70–99)
POTASSIUM: 4.5 meq/L (ref 3.7–5.3)
SODIUM: 136 meq/L — AB (ref 137–147)

## 2014-06-18 LAB — CBC
HCT: 37.2 % — ABNORMAL LOW (ref 39.0–52.0)
Hemoglobin: 12.5 g/dL — ABNORMAL LOW (ref 13.0–17.0)
MCH: 32.9 pg (ref 26.0–34.0)
MCHC: 33.6 g/dL (ref 30.0–36.0)
MCV: 97.9 fL (ref 78.0–100.0)
Platelets: 240 10*3/uL (ref 150–400)
RBC: 3.8 MIL/uL — ABNORMAL LOW (ref 4.22–5.81)
RDW: 13.7 % (ref 11.5–15.5)
WBC: 13.2 10*3/uL — AB (ref 4.0–10.5)

## 2014-06-18 NOTE — Progress Notes (Signed)
   Subjective: 2 Days Post-Op Procedure(s) (LRB): LEFT TOTAL HIP ARTHROPLASTY ANTERIOR APPROACH (Left) Patient reports pain as mild.   Patient seen in rounds with Dr. Wynelle Link. Patient is well, and has had no acute complaints or problems Patient is ready to go home  Objective: Vital signs in last 24 hours: Temp:  [98.3 F (36.8 C)-99 F (37.2 C)] 99 F (37.2 C) (12/16 0446) Pulse Rate:  [66-68] 67 (12/16 0446) Resp:  [16] 16 (12/16 0446) BP: (102-117)/(48-84) 117/65 mmHg (12/16 0446) SpO2:  [93 %-96 %] 95 % (12/16 0446)  Intake/Output from previous day:  Intake/Output Summary (Last 24 hours) at 06/18/14 0803 Last data filed at 06/17/14 2142  Gross per 24 hour  Intake    480 ml  Output    350 ml  Net    130 ml     Labs:  Recent Labs  06/17/14 0411 06/18/14 0353  HGB 12.2* 12.5*    Recent Labs  06/17/14 0411 06/18/14 0353  WBC 11.5* 13.2*  RBC 3.67* 3.80*  HCT 36.1* 37.2*  PLT 245 240    Recent Labs  06/17/14 0411 06/18/14 0353  NA 134* 136*  K 4.7 4.5  CL 99 101  CO2 22 24  BUN 15 21  CREATININE 1.22 1.23  GLUCOSE 160* 117*  CALCIUM 8.4 8.4   No results for input(s): LABPT, INR in the last 72 hours.  EXAM: General - Patient is Alert, Appropriate and Oriented Extremity - Neurovascular intact Sensation intact distally Dorsiflexion/Plantar flexion intact Incision - clean, dry, no drainage Motor Function - intact, moving foot and toes well on exam.    Assessment/Plan: 2 Days Post-Op Procedure(s) (LRB): LEFT TOTAL HIP ARTHROPLASTY ANTERIOR APPROACH (Left) Procedure(s) (LRB): LEFT TOTAL HIP ARTHROPLASTY ANTERIOR APPROACH (Left) Past Medical History  Diagnosis Date  . Hypertension   . Cancer     thyroid  . Hypercholesterolemia   . Biceps tendon rupture     hx of  . Capsulitis of shoulder     hx of    Principal Problem:   OA (osteoarthritis) of hip  Estimated body mass index is 23.63 kg/(m^2) as calculated from the following:   Height  as of this encounter: 5\' 5"  (1.651 m).   Weight as of this encounter: 64.411 kg (142 lb). Up with therapy Discharge home with home health Diet - Cardiac diet Follow up - in 2 weeks Activity - WBAT Disposition - Home Condition Upon Discharge - Good D/C Meds - See DC Summary DVT Prophylaxis - Xarelto  Arlee Muslim, PA-C Orthopaedic Surgery 06/18/2014, 8:03 AM

## 2014-06-18 NOTE — Plan of Care (Signed)
Problem: Discharge Progression Outcomes Goal: Anticoagulant follow-up in place Outcome: Not Applicable Date Met:  65/03/54 Xarelto VTE, no f/u needed.

## 2014-06-18 NOTE — Progress Notes (Signed)
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  Pt performed LE exercises and provided with HEP handout.  Pt educated to slow pace and consider safety while recovering.  Pt had no further questions and feels ready for d/c home today.  Follow Up Recommendations  Home health PT     Equipment Recommendations  None recommended by PT    Recommendations for Other Services       Precautions / Restrictions Precautions Precautions: Fall Restrictions Other Position/Activity Restrictions: WBAT    Mobility  Bed Mobility Overal bed mobility: Needs Assistance Bed Mobility: Supine to Sit     Supine to sit: Supervision        Transfers Overall transfer level: Needs assistance Equipment used: Rolling walker (2 wheeled) Transfers: Sit to/from Stand Sit to Stand: Supervision         General transfer comment: verbal cues for waiting for RW in front  Ambulation/Gait Ambulation/Gait assistance: Supervision Ambulation Distance (Feet): 350 Feet Assistive device: Rolling walker (2 wheeled) Gait Pattern/deviations: Step-through pattern;Antalgic     General Gait Details: verbal cues for RW positioning, posture, slowing pace for safety   Stairs Stairs: Yes Stairs assistance: Min guard Stair Management: Step to pattern;Backwards;With walker Number of Stairs: 4 General stair comments: verbal cues for safety and technique for backwards with RW with daughter present and assisting with RW  Wheelchair Mobility    Modified Rankin (Stroke Patients Only)       Balance                                    Cognition Arousal/Alertness: Awake/alert Behavior During Therapy: WFL for tasks  assessed/performed Overall Cognitive Status: Within Functional Limits for tasks assessed                      Exercises Total Joint Exercises Ankle Circles/Pumps: AROM;Both;20 reps Quad Sets: AROM;Both;20 reps Heel Slides: AROM;Left;20 reps;Supine Hip ABduction/ADduction: AROM;Standing;Left;10 reps;20 reps Straight Leg Raises: AROM;Left;Supine;10 reps Knee Flexion: AROM;Left;15 reps Marching in Standing: AROM;Standing;Left;15 reps Standing Hip Extension: AROM;Left;Standing;15 reps    General Comments        Pertinent Vitals/Pain Pain Assessment: 0-10 Pain Score: 2  Pain Location: L hip Pain Descriptors / Indicators: Sore Pain Intervention(s): Monitored during session;Repositioned    Home Living                      Prior Function            PT Goals (current goals can now be found in the care plan section) Progress towards PT goals: Progressing toward goals    Frequency  7X/week    PT Plan Current plan remains appropriate    Co-evaluation             End of Session   Activity Tolerance: Patient tolerated treatment well Patient left: in chair;with call bell/phone within reach;with family/visitor present     Time: 4098-1191 PT Time Calculation (min) (ACUTE ONLY): 29 min  Charges:  $Gait Training: 8-22 mins $Therapeutic Exercise: 8-22 mins  G Codes:      Keondrick Dilks,KATHrine E 07-02-2014, 1:06 PM Carmelia Bake, PT, DPT 07-02-14 Pager: [phone]

## 2015-07-22 DIAGNOSIS — N183 Chronic kidney disease, stage 3 (moderate): Secondary | ICD-10-CM | POA: Diagnosis not present

## 2015-07-22 DIAGNOSIS — Z0000 Encounter for general adult medical examination without abnormal findings: Secondary | ICD-10-CM | POA: Diagnosis not present

## 2015-07-22 DIAGNOSIS — E7800 Pure hypercholesterolemia, unspecified: Secondary | ICD-10-CM | POA: Diagnosis not present

## 2015-07-22 DIAGNOSIS — R972 Elevated prostate specific antigen [PSA]: Secondary | ICD-10-CM | POA: Diagnosis not present

## 2015-07-22 DIAGNOSIS — Z1389 Encounter for screening for other disorder: Secondary | ICD-10-CM | POA: Diagnosis not present

## 2015-07-22 DIAGNOSIS — E890 Postprocedural hypothyroidism: Secondary | ICD-10-CM | POA: Diagnosis not present

## 2015-07-22 DIAGNOSIS — I129 Hypertensive chronic kidney disease with stage 1 through stage 4 chronic kidney disease, or unspecified chronic kidney disease: Secondary | ICD-10-CM | POA: Diagnosis not present

## 2015-09-07 DIAGNOSIS — E890 Postprocedural hypothyroidism: Secondary | ICD-10-CM | POA: Diagnosis not present

## 2015-09-30 DIAGNOSIS — Z85828 Personal history of other malignant neoplasm of skin: Secondary | ICD-10-CM | POA: Diagnosis not present

## 2015-09-30 DIAGNOSIS — L812 Freckles: Secondary | ICD-10-CM | POA: Diagnosis not present

## 2015-09-30 DIAGNOSIS — L570 Actinic keratosis: Secondary | ICD-10-CM | POA: Diagnosis not present

## 2015-09-30 DIAGNOSIS — D1801 Hemangioma of skin and subcutaneous tissue: Secondary | ICD-10-CM | POA: Diagnosis not present

## 2015-09-30 DIAGNOSIS — L821 Other seborrheic keratosis: Secondary | ICD-10-CM | POA: Diagnosis not present

## 2016-01-19 DIAGNOSIS — I129 Hypertensive chronic kidney disease with stage 1 through stage 4 chronic kidney disease, or unspecified chronic kidney disease: Secondary | ICD-10-CM | POA: Diagnosis not present

## 2016-01-19 DIAGNOSIS — N183 Chronic kidney disease, stage 3 (moderate): Secondary | ICD-10-CM | POA: Diagnosis not present

## 2016-01-19 DIAGNOSIS — E039 Hypothyroidism, unspecified: Secondary | ICD-10-CM | POA: Diagnosis not present

## 2016-01-26 DIAGNOSIS — R200 Anesthesia of skin: Secondary | ICD-10-CM | POA: Diagnosis not present

## 2016-01-26 DIAGNOSIS — G5601 Carpal tunnel syndrome, right upper limb: Secondary | ICD-10-CM | POA: Diagnosis not present

## 2016-01-26 DIAGNOSIS — R202 Paresthesia of skin: Secondary | ICD-10-CM | POA: Diagnosis not present

## 2016-01-26 DIAGNOSIS — M79641 Pain in right hand: Secondary | ICD-10-CM | POA: Diagnosis not present

## 2016-02-20 DIAGNOSIS — G5601 Carpal tunnel syndrome, right upper limb: Secondary | ICD-10-CM | POA: Diagnosis not present

## 2016-02-20 DIAGNOSIS — G5602 Carpal tunnel syndrome, left upper limb: Secondary | ICD-10-CM | POA: Diagnosis not present

## 2016-04-07 DIAGNOSIS — M79642 Pain in left hand: Secondary | ICD-10-CM | POA: Diagnosis not present

## 2016-04-07 DIAGNOSIS — G5601 Carpal tunnel syndrome, right upper limb: Secondary | ICD-10-CM | POA: Diagnosis not present

## 2016-04-07 DIAGNOSIS — M79641 Pain in right hand: Secondary | ICD-10-CM | POA: Diagnosis not present

## 2016-04-07 DIAGNOSIS — G5602 Carpal tunnel syndrome, left upper limb: Secondary | ICD-10-CM | POA: Diagnosis not present

## 2016-05-10 DIAGNOSIS — G5601 Carpal tunnel syndrome, right upper limb: Secondary | ICD-10-CM | POA: Diagnosis not present

## 2016-05-16 DIAGNOSIS — Z4789 Encounter for other orthopedic aftercare: Secondary | ICD-10-CM | POA: Diagnosis not present

## 2016-05-18 DIAGNOSIS — R972 Elevated prostate specific antigen [PSA]: Secondary | ICD-10-CM | POA: Diagnosis not present

## 2016-05-24 DIAGNOSIS — M79641 Pain in right hand: Secondary | ICD-10-CM | POA: Diagnosis not present

## 2016-05-25 DIAGNOSIS — N400 Enlarged prostate without lower urinary tract symptoms: Secondary | ICD-10-CM | POA: Diagnosis not present

## 2016-05-25 DIAGNOSIS — R972 Elevated prostate specific antigen [PSA]: Secondary | ICD-10-CM | POA: Diagnosis not present

## 2016-06-06 DIAGNOSIS — Z4789 Encounter for other orthopedic aftercare: Secondary | ICD-10-CM | POA: Diagnosis not present

## 2016-06-07 DIAGNOSIS — M79641 Pain in right hand: Secondary | ICD-10-CM | POA: Diagnosis not present

## 2016-07-19 DIAGNOSIS — Z1389 Encounter for screening for other disorder: Secondary | ICD-10-CM | POA: Diagnosis not present

## 2016-07-19 DIAGNOSIS — N400 Enlarged prostate without lower urinary tract symptoms: Secondary | ICD-10-CM | POA: Diagnosis not present

## 2016-07-19 DIAGNOSIS — E890 Postprocedural hypothyroidism: Secondary | ICD-10-CM | POA: Diagnosis not present

## 2016-07-19 DIAGNOSIS — E7800 Pure hypercholesterolemia, unspecified: Secondary | ICD-10-CM | POA: Diagnosis not present

## 2016-07-19 DIAGNOSIS — I129 Hypertensive chronic kidney disease with stage 1 through stage 4 chronic kidney disease, or unspecified chronic kidney disease: Secondary | ICD-10-CM | POA: Diagnosis not present

## 2016-07-19 DIAGNOSIS — N183 Chronic kidney disease, stage 3 (moderate): Secondary | ICD-10-CM | POA: Diagnosis not present

## 2016-07-19 DIAGNOSIS — Z0000 Encounter for general adult medical examination without abnormal findings: Secondary | ICD-10-CM | POA: Diagnosis not present

## 2016-10-03 DIAGNOSIS — L820 Inflamed seborrheic keratosis: Secondary | ICD-10-CM | POA: Diagnosis not present

## 2016-10-03 DIAGNOSIS — L570 Actinic keratosis: Secondary | ICD-10-CM | POA: Diagnosis not present

## 2016-10-03 DIAGNOSIS — D225 Melanocytic nevi of trunk: Secondary | ICD-10-CM | POA: Diagnosis not present

## 2016-10-03 DIAGNOSIS — L821 Other seborrheic keratosis: Secondary | ICD-10-CM | POA: Diagnosis not present

## 2016-11-18 DIAGNOSIS — L570 Actinic keratosis: Secondary | ICD-10-CM | POA: Diagnosis not present

## 2016-11-18 DIAGNOSIS — C44729 Squamous cell carcinoma of skin of left lower limb, including hip: Secondary | ICD-10-CM | POA: Diagnosis not present

## 2016-12-15 DIAGNOSIS — C44729 Squamous cell carcinoma of skin of left lower limb, including hip: Secondary | ICD-10-CM | POA: Diagnosis not present

## 2016-12-15 DIAGNOSIS — Z85828 Personal history of other malignant neoplasm of skin: Secondary | ICD-10-CM | POA: Diagnosis not present

## 2016-12-15 DIAGNOSIS — L570 Actinic keratosis: Secondary | ICD-10-CM | POA: Diagnosis not present

## 2016-12-15 DIAGNOSIS — C44721 Squamous cell carcinoma of skin of unspecified lower limb, including hip: Secondary | ICD-10-CM | POA: Diagnosis not present

## 2016-12-21 DIAGNOSIS — C44729 Squamous cell carcinoma of skin of left lower limb, including hip: Secondary | ICD-10-CM | POA: Diagnosis not present

## 2017-02-14 DIAGNOSIS — D485 Neoplasm of uncertain behavior of skin: Secondary | ICD-10-CM | POA: Diagnosis not present

## 2017-02-14 DIAGNOSIS — L570 Actinic keratosis: Secondary | ICD-10-CM | POA: Diagnosis not present

## 2017-02-14 DIAGNOSIS — C44729 Squamous cell carcinoma of skin of left lower limb, including hip: Secondary | ICD-10-CM | POA: Diagnosis not present

## 2017-02-23 DIAGNOSIS — C44729 Squamous cell carcinoma of skin of left lower limb, including hip: Secondary | ICD-10-CM | POA: Diagnosis not present

## 2017-03-16 DIAGNOSIS — R05 Cough: Secondary | ICD-10-CM | POA: Diagnosis not present

## 2017-03-16 DIAGNOSIS — J069 Acute upper respiratory infection, unspecified: Secondary | ICD-10-CM | POA: Diagnosis not present

## 2017-04-03 DIAGNOSIS — N183 Chronic kidney disease, stage 3 (moderate): Secondary | ICD-10-CM | POA: Diagnosis not present

## 2017-04-03 DIAGNOSIS — I129 Hypertensive chronic kidney disease with stage 1 through stage 4 chronic kidney disease, or unspecified chronic kidney disease: Secondary | ICD-10-CM | POA: Diagnosis not present

## 2017-04-03 DIAGNOSIS — E890 Postprocedural hypothyroidism: Secondary | ICD-10-CM | POA: Diagnosis not present

## 2017-04-18 DIAGNOSIS — Z85828 Personal history of other malignant neoplasm of skin: Secondary | ICD-10-CM | POA: Diagnosis not present

## 2017-04-18 DIAGNOSIS — D225 Melanocytic nevi of trunk: Secondary | ICD-10-CM | POA: Diagnosis not present

## 2017-05-19 DIAGNOSIS — R972 Elevated prostate specific antigen [PSA]: Secondary | ICD-10-CM | POA: Diagnosis not present

## 2017-05-31 DIAGNOSIS — N5201 Erectile dysfunction due to arterial insufficiency: Secondary | ICD-10-CM | POA: Diagnosis not present

## 2017-05-31 DIAGNOSIS — R350 Frequency of micturition: Secondary | ICD-10-CM | POA: Diagnosis not present

## 2017-05-31 DIAGNOSIS — R972 Elevated prostate specific antigen [PSA]: Secondary | ICD-10-CM | POA: Diagnosis not present

## 2017-05-31 DIAGNOSIS — N401 Enlarged prostate with lower urinary tract symptoms: Secondary | ICD-10-CM | POA: Diagnosis not present

## 2017-07-10 DIAGNOSIS — L570 Actinic keratosis: Secondary | ICD-10-CM | POA: Diagnosis not present

## 2017-07-10 DIAGNOSIS — L814 Other melanin hyperpigmentation: Secondary | ICD-10-CM | POA: Diagnosis not present

## 2017-07-10 DIAGNOSIS — D225 Melanocytic nevi of trunk: Secondary | ICD-10-CM | POA: Diagnosis not present

## 2017-07-10 DIAGNOSIS — L821 Other seborrheic keratosis: Secondary | ICD-10-CM | POA: Diagnosis not present

## 2017-07-10 DIAGNOSIS — Z85828 Personal history of other malignant neoplasm of skin: Secondary | ICD-10-CM | POA: Diagnosis not present

## 2017-10-03 DIAGNOSIS — E7800 Pure hypercholesterolemia, unspecified: Secondary | ICD-10-CM | POA: Diagnosis not present

## 2017-10-03 DIAGNOSIS — Z0000 Encounter for general adult medical examination without abnormal findings: Secondary | ICD-10-CM | POA: Diagnosis not present

## 2017-10-03 DIAGNOSIS — N183 Chronic kidney disease, stage 3 (moderate): Secondary | ICD-10-CM | POA: Diagnosis not present

## 2017-10-03 DIAGNOSIS — E890 Postprocedural hypothyroidism: Secondary | ICD-10-CM | POA: Diagnosis not present

## 2017-10-03 DIAGNOSIS — N400 Enlarged prostate without lower urinary tract symptoms: Secondary | ICD-10-CM | POA: Diagnosis not present

## 2017-10-03 DIAGNOSIS — Z1389 Encounter for screening for other disorder: Secondary | ICD-10-CM | POA: Diagnosis not present

## 2017-10-03 DIAGNOSIS — I129 Hypertensive chronic kidney disease with stage 1 through stage 4 chronic kidney disease, or unspecified chronic kidney disease: Secondary | ICD-10-CM | POA: Diagnosis not present

## 2017-12-11 DIAGNOSIS — L570 Actinic keratosis: Secondary | ICD-10-CM | POA: Diagnosis not present

## 2017-12-11 DIAGNOSIS — D485 Neoplasm of uncertain behavior of skin: Secondary | ICD-10-CM | POA: Diagnosis not present

## 2018-04-04 DIAGNOSIS — N183 Chronic kidney disease, stage 3 (moderate): Secondary | ICD-10-CM | POA: Diagnosis not present

## 2018-04-04 DIAGNOSIS — E7800 Pure hypercholesterolemia, unspecified: Secondary | ICD-10-CM | POA: Diagnosis not present

## 2018-04-04 DIAGNOSIS — N400 Enlarged prostate without lower urinary tract symptoms: Secondary | ICD-10-CM | POA: Diagnosis not present

## 2018-04-04 DIAGNOSIS — G720 Drug-induced myopathy: Secondary | ICD-10-CM | POA: Diagnosis not present

## 2018-04-04 DIAGNOSIS — E890 Postprocedural hypothyroidism: Secondary | ICD-10-CM | POA: Diagnosis not present

## 2018-04-04 DIAGNOSIS — I129 Hypertensive chronic kidney disease with stage 1 through stage 4 chronic kidney disease, or unspecified chronic kidney disease: Secondary | ICD-10-CM | POA: Diagnosis not present

## 2018-05-21 DIAGNOSIS — N5201 Erectile dysfunction due to arterial insufficiency: Secondary | ICD-10-CM | POA: Diagnosis not present

## 2018-05-21 DIAGNOSIS — R351 Nocturia: Secondary | ICD-10-CM | POA: Diagnosis not present

## 2018-05-21 DIAGNOSIS — N401 Enlarged prostate with lower urinary tract symptoms: Secondary | ICD-10-CM | POA: Diagnosis not present

## 2018-05-21 DIAGNOSIS — R972 Elevated prostate specific antigen [PSA]: Secondary | ICD-10-CM | POA: Diagnosis not present

## 2018-07-05 DIAGNOSIS — M7022 Olecranon bursitis, left elbow: Secondary | ICD-10-CM | POA: Diagnosis not present

## 2018-07-09 DIAGNOSIS — N183 Chronic kidney disease, stage 3 (moderate): Secondary | ICD-10-CM | POA: Diagnosis not present

## 2018-07-09 DIAGNOSIS — R05 Cough: Secondary | ICD-10-CM | POA: Diagnosis not present

## 2018-07-09 DIAGNOSIS — M7022 Olecranon bursitis, left elbow: Secondary | ICD-10-CM | POA: Diagnosis not present

## 2018-07-09 DIAGNOSIS — I129 Hypertensive chronic kidney disease with stage 1 through stage 4 chronic kidney disease, or unspecified chronic kidney disease: Secondary | ICD-10-CM | POA: Diagnosis not present

## 2018-07-10 ENCOUNTER — Other Ambulatory Visit: Payer: Self-pay | Admitting: Family Medicine

## 2018-07-10 ENCOUNTER — Ambulatory Visit
Admission: RE | Admit: 2018-07-10 | Discharge: 2018-07-10 | Disposition: A | Discharge status: Home / Self Care | Payer: Medicare Other | Source: Ambulatory Visit | Attending: Family Medicine | Admitting: Family Medicine

## 2018-07-10 DIAGNOSIS — R058 Other specified cough: Secondary | ICD-10-CM

## 2018-07-10 DIAGNOSIS — R05 Cough: Secondary | ICD-10-CM | POA: Diagnosis not present

## 2018-08-04 DIAGNOSIS — M7022 Olecranon bursitis, left elbow: Secondary | ICD-10-CM | POA: Diagnosis not present

## 2018-08-14 DIAGNOSIS — Z85828 Personal history of other malignant neoplasm of skin: Secondary | ICD-10-CM | POA: Diagnosis not present

## 2018-08-14 DIAGNOSIS — D225 Melanocytic nevi of trunk: Secondary | ICD-10-CM | POA: Diagnosis not present

## 2018-08-14 DIAGNOSIS — D1801 Hemangioma of skin and subcutaneous tissue: Secondary | ICD-10-CM | POA: Diagnosis not present

## 2018-08-14 DIAGNOSIS — L821 Other seborrheic keratosis: Secondary | ICD-10-CM | POA: Diagnosis not present

## 2018-08-14 DIAGNOSIS — L570 Actinic keratosis: Secondary | ICD-10-CM | POA: Diagnosis not present

## 2018-10-10 DIAGNOSIS — I129 Hypertensive chronic kidney disease with stage 1 through stage 4 chronic kidney disease, or unspecified chronic kidney disease: Secondary | ICD-10-CM | POA: Diagnosis not present

## 2018-10-10 DIAGNOSIS — E7800 Pure hypercholesterolemia, unspecified: Secondary | ICD-10-CM | POA: Diagnosis not present

## 2018-10-10 DIAGNOSIS — Z0000 Encounter for general adult medical examination without abnormal findings: Secondary | ICD-10-CM | POA: Diagnosis not present

## 2018-10-10 DIAGNOSIS — R21 Rash and other nonspecific skin eruption: Secondary | ICD-10-CM | POA: Diagnosis not present

## 2018-10-10 DIAGNOSIS — E890 Postprocedural hypothyroidism: Secondary | ICD-10-CM | POA: Diagnosis not present

## 2018-10-10 DIAGNOSIS — Z1389 Encounter for screening for other disorder: Secondary | ICD-10-CM | POA: Diagnosis not present

## 2018-10-10 DIAGNOSIS — N183 Chronic kidney disease, stage 3 (moderate): Secondary | ICD-10-CM | POA: Diagnosis not present

## 2018-10-10 DIAGNOSIS — N400 Enlarged prostate without lower urinary tract symptoms: Secondary | ICD-10-CM | POA: Diagnosis not present

## 2019-01-24 DIAGNOSIS — M7121 Synovial cyst of popliteal space [Baker], right knee: Secondary | ICD-10-CM | POA: Diagnosis not present

## 2019-02-13 DIAGNOSIS — C44722 Squamous cell carcinoma of skin of right lower limb, including hip: Secondary | ICD-10-CM | POA: Diagnosis not present

## 2019-02-13 DIAGNOSIS — Z85828 Personal history of other malignant neoplasm of skin: Secondary | ICD-10-CM | POA: Diagnosis not present

## 2019-02-13 DIAGNOSIS — C44729 Squamous cell carcinoma of skin of left lower limb, including hip: Secondary | ICD-10-CM | POA: Diagnosis not present

## 2019-02-13 DIAGNOSIS — D1801 Hemangioma of skin and subcutaneous tissue: Secondary | ICD-10-CM | POA: Diagnosis not present

## 2019-02-13 DIAGNOSIS — D225 Melanocytic nevi of trunk: Secondary | ICD-10-CM | POA: Diagnosis not present

## 2019-02-13 DIAGNOSIS — L821 Other seborrheic keratosis: Secondary | ICD-10-CM | POA: Diagnosis not present

## 2019-02-13 DIAGNOSIS — D485 Neoplasm of uncertain behavior of skin: Secondary | ICD-10-CM | POA: Diagnosis not present

## 2019-02-13 DIAGNOSIS — L570 Actinic keratosis: Secondary | ICD-10-CM | POA: Diagnosis not present

## 2019-02-14 DIAGNOSIS — M25561 Pain in right knee: Secondary | ICD-10-CM | POA: Diagnosis not present

## 2019-02-15 DIAGNOSIS — M1711 Unilateral primary osteoarthritis, right knee: Secondary | ICD-10-CM | POA: Diagnosis not present

## 2019-02-15 DIAGNOSIS — M25561 Pain in right knee: Secondary | ICD-10-CM | POA: Diagnosis not present

## 2019-02-19 DIAGNOSIS — C44722 Squamous cell carcinoma of skin of right lower limb, including hip: Secondary | ICD-10-CM | POA: Diagnosis not present

## 2019-02-19 DIAGNOSIS — C44729 Squamous cell carcinoma of skin of left lower limb, including hip: Secondary | ICD-10-CM | POA: Diagnosis not present

## 2019-02-20 DIAGNOSIS — M7121 Synovial cyst of popliteal space [Baker], right knee: Secondary | ICD-10-CM | POA: Diagnosis not present

## 2019-04-08 DIAGNOSIS — I129 Hypertensive chronic kidney disease with stage 1 through stage 4 chronic kidney disease, or unspecified chronic kidney disease: Secondary | ICD-10-CM | POA: Diagnosis not present

## 2019-04-08 DIAGNOSIS — E890 Postprocedural hypothyroidism: Secondary | ICD-10-CM | POA: Diagnosis not present

## 2019-04-08 DIAGNOSIS — N183 Chronic kidney disease, stage 3 unspecified: Secondary | ICD-10-CM | POA: Diagnosis not present

## 2019-04-08 DIAGNOSIS — N400 Enlarged prostate without lower urinary tract symptoms: Secondary | ICD-10-CM | POA: Diagnosis not present

## 2019-04-19 DIAGNOSIS — M25462 Effusion, left knee: Secondary | ICD-10-CM | POA: Diagnosis not present

## 2019-04-19 DIAGNOSIS — M25562 Pain in left knee: Secondary | ICD-10-CM | POA: Diagnosis not present

## 2019-05-23 DIAGNOSIS — Z85828 Personal history of other malignant neoplasm of skin: Secondary | ICD-10-CM | POA: Diagnosis not present

## 2019-05-23 DIAGNOSIS — L821 Other seborrheic keratosis: Secondary | ICD-10-CM | POA: Diagnosis not present

## 2019-05-23 DIAGNOSIS — D1801 Hemangioma of skin and subcutaneous tissue: Secondary | ICD-10-CM | POA: Diagnosis not present

## 2019-05-23 DIAGNOSIS — L570 Actinic keratosis: Secondary | ICD-10-CM | POA: Diagnosis not present

## 2019-05-23 DIAGNOSIS — D225 Melanocytic nevi of trunk: Secondary | ICD-10-CM | POA: Diagnosis not present

## 2019-07-05 ENCOUNTER — Other Ambulatory Visit: Payer: Self-pay | Admitting: Unknown Physician Specialty

## 2019-07-05 ENCOUNTER — Telehealth: Payer: Self-pay | Admitting: Unknown Physician Specialty

## 2019-07-05 DIAGNOSIS — U071 COVID-19: Secondary | ICD-10-CM

## 2019-07-05 NOTE — Telephone Encounter (Signed)
  I connected by phone with [name] on 07/05/2019 at 11:12 AM to discuss the potential use of an new treatment for mild to moderate COVID-19 viral infection in non-hospitalized patients.  This patient is a 86 y.o. male that meets the FDA criteria for Emergency Use Authorization of bamlanivimab or casirivimab\imdevimab.  Has a (+) direct SARS-CoV-2 viral test result  Has mild or moderate COVID-19   Is ? 84 years of age and weighs ? 40 kg  Is NOT hospitalized due to COVID-19  Is NOT requiring oxygen therapy or requiring an increase in baseline oxygen flow rate due to COVID-19  Is within 10 days of symptom onset  Has at least one of the high risk factor(s) for progression to severe COVID-19 and/or hospitalization as defined in EUA.  Specific high risk criteria : >/= 84 yo   I have spoken and communicated the following to the patient or parent/caregiver:  1. FDA has authorized the emergency use of bamlanivimab and casirivimab\imdevimab for the treatment of mild to moderate COVID-19 in adults and pediatric patients with positive results of direct SARS-CoV-2 viral testing who are 84 years of age and older weighing at least 40 kg, and who are at high risk for progressing to severe COVID-19 and/or hospitalization.  2. The significant known and potential risks and benefits of bamlanivimab and casirivimab\imdevimab, and the extent to which such potential risks and benefits are unknown.  3. Information on available alternative treatments and the risks and benefits of those alternatives, including clinical trials.  4. Patients treated with bamlanivimab and casirivimab\imdevimab should continue to self-isolate and use infection control measures (e.g., wear mask, isolate, social distance, avoid sharing personal items, clean and disinfect "high touch" surfaces, and frequent handwashing) according to CDC guidelines.   5. The patient or parent/caregiver has the option to accept or refuse  bamlanivimab or casirivimab\imdevimab .  After reviewing this information with the patient, The patient agreed to proceed with receiving the bamlanimivab infusion and will be provided a copy of the Fact sheet prior to receiving the infusion. Kathrine Haddock 07/05/2019 11:12 AM Symptom onset 12/27

## 2019-07-09 ENCOUNTER — Ambulatory Visit (HOSPITAL_COMMUNITY)
Admission: RE | Admit: 2019-07-09 | Discharge: 2019-07-09 | Disposition: A | Discharge status: Home / Self Care | Payer: Medicare Other | Source: Ambulatory Visit | Attending: Pulmonary Disease | Admitting: Pulmonary Disease

## 2019-07-09 DIAGNOSIS — Z23 Encounter for immunization: Secondary | ICD-10-CM | POA: Diagnosis not present

## 2019-07-09 DIAGNOSIS — U071 COVID-19: Secondary | ICD-10-CM | POA: Insufficient documentation

## 2019-07-09 MED ORDER — SODIUM CHLORIDE 0.9 % IV SOLN
INTRAVENOUS | Status: DC | PRN
  Administered 2019-07-09: 250 mL via INTRAVENOUS

## 2019-07-09 MED ORDER — FAMOTIDINE IN NACL 20-0.9 MG/50ML-% IV SOLN: 20.0000 mg | Freq: Once | INTRAVENOUS | Status: DC | PRN

## 2019-07-09 MED ORDER — DIPHENHYDRAMINE HCL 50 MG/ML IJ SOLN: 50.0000 mg | Freq: Once | INTRAMUSCULAR | Status: DC | PRN

## 2019-07-09 MED ORDER — SODIUM CHLORIDE 0.9 % IV SOLN
700.0000 mg | Freq: Once | INTRAVENOUS | Status: AC
  Administered 2019-07-09: 13:00:00 700 mg via INTRAVENOUS
  Filled 2019-07-09: qty 20

## 2019-07-09 MED ORDER — METHYLPREDNISOLONE SODIUM SUCC 125 MG IJ SOLR: 125.0000 mg | Freq: Once | INTRAMUSCULAR | Status: DC | PRN

## 2019-07-09 MED ORDER — ALBUTEROL SULFATE HFA 108 (90 BASE) MCG/ACT IN AERS: 2.0000 | INHALATION_SPRAY | Freq: Once | RESPIRATORY_TRACT | Status: DC | PRN

## 2019-07-09 MED ORDER — EPINEPHRINE 0.3 MG/0.3ML IJ SOAJ: 0.3000 mg | Freq: Once | INTRAMUSCULAR | Status: DC | PRN

## 2019-07-09 NOTE — Discharge Instructions (Signed)

## 2019-07-09 NOTE — Progress Notes (Signed)
Patient ID: [name], male   DOB: [birth year], 86 y.o.   MRN: [id number]  Diagnosis: [id number]  Physician:  Procedure: [facility] Med: bamlanivimab infusion - Provided patient with bamlanimivab fact sheet for patients, parents and caregivers prior to infusion.  Complications: No immediate complications noted.  Discharge: Discharged home   Heide Scales 07/09/2019

## 2019-07-22 DIAGNOSIS — M1711 Unilateral primary osteoarthritis, right knee: Secondary | ICD-10-CM | POA: Diagnosis not present

## 2019-07-22 DIAGNOSIS — M25561 Pain in right knee: Secondary | ICD-10-CM | POA: Diagnosis not present

## 2019-07-24 DIAGNOSIS — N401 Enlarged prostate with lower urinary tract symptoms: Secondary | ICD-10-CM | POA: Diagnosis not present

## 2019-07-24 DIAGNOSIS — N5201 Erectile dysfunction due to arterial insufficiency: Secondary | ICD-10-CM | POA: Diagnosis not present

## 2019-07-24 DIAGNOSIS — R3912 Poor urinary stream: Secondary | ICD-10-CM | POA: Diagnosis not present

## 2019-07-29 DIAGNOSIS — M1711 Unilateral primary osteoarthritis, right knee: Secondary | ICD-10-CM | POA: Diagnosis not present

## 2019-09-30 DIAGNOSIS — M1711 Unilateral primary osteoarthritis, right knee: Secondary | ICD-10-CM | POA: Diagnosis not present

## 2019-09-30 DIAGNOSIS — M25461 Effusion, right knee: Secondary | ICD-10-CM | POA: Diagnosis not present

## 2019-11-12 DIAGNOSIS — I129 Hypertensive chronic kidney disease with stage 1 through stage 4 chronic kidney disease, or unspecified chronic kidney disease: Secondary | ICD-10-CM | POA: Diagnosis not present

## 2019-11-12 DIAGNOSIS — E890 Postprocedural hypothyroidism: Secondary | ICD-10-CM | POA: Diagnosis not present

## 2019-11-12 DIAGNOSIS — R079 Chest pain, unspecified: Secondary | ICD-10-CM | POA: Diagnosis not present

## 2019-11-12 DIAGNOSIS — N1831 Chronic kidney disease, stage 3a: Secondary | ICD-10-CM | POA: Diagnosis not present

## 2019-11-12 DIAGNOSIS — N400 Enlarged prostate without lower urinary tract symptoms: Secondary | ICD-10-CM | POA: Diagnosis not present

## 2019-11-12 DIAGNOSIS — Z1389 Encounter for screening for other disorder: Secondary | ICD-10-CM | POA: Diagnosis not present

## 2019-11-12 DIAGNOSIS — E7800 Pure hypercholesterolemia, unspecified: Secondary | ICD-10-CM | POA: Diagnosis not present

## 2019-11-12 DIAGNOSIS — Z0000 Encounter for general adult medical examination without abnormal findings: Secondary | ICD-10-CM | POA: Diagnosis not present

## 2019-11-20 DIAGNOSIS — L905 Scar conditions and fibrosis of skin: Secondary | ICD-10-CM | POA: Diagnosis not present

## 2019-11-20 DIAGNOSIS — Z85828 Personal history of other malignant neoplasm of skin: Secondary | ICD-10-CM | POA: Diagnosis not present

## 2019-11-20 DIAGNOSIS — L570 Actinic keratosis: Secondary | ICD-10-CM | POA: Diagnosis not present

## 2019-11-20 DIAGNOSIS — L819 Disorder of pigmentation, unspecified: Secondary | ICD-10-CM | POA: Diagnosis not present

## 2019-12-10 ENCOUNTER — Telehealth: Payer: Self-pay | Admitting: Cardiology

## 2019-12-10 NOTE — Telephone Encounter (Signed)
Follow up ° ° °Patient is returning your call. Please call. ° ° ° °

## 2019-12-10 NOTE — Telephone Encounter (Signed)
LMTCB to assess pt symptoms and determine new appt date if possible.

## 2019-12-10 NOTE — Telephone Encounter (Signed)
[name] is calling requesting a sooner New Patient appointment if at all possible with Dr. Stanford Breed. He states he feels he needs to be seen sooner than 01/27/20 due to his current state & symptoms, but doesn't want to see another provider due to his wife seeing Dr. Stanford Breed as well. I advised [name] I do not have any sooner appointments available at this time, but that I would send a message to see if his nurse is able to work him in sooner. Please advise.

## 2019-12-11 NOTE — Telephone Encounter (Signed)
Spoke with pt, he has a new patient appointment on 01-27-20, he had covid around [place] and continues to have weakness and loss of strength that comes and goes. He reports his bp is good. His thyroid function was abnormal and he felt better once his medication was adjusted but continues to have weakness. He also reports chest pain or tightness in his chest off and on that can occur daily to every other day. It can come at rest or with activity and can last up to one hour. He will occ have left arm numbness with or without the tightness in the chest but has a hx of shoulder injury that he feels plays a roll in the left arm numbness. His wife has an appointment on 01-07-20 and those appointments were switched as she is doing fine. Will forward for dr Stanford Breed review to see if any testing needed prior to appointment. Pt agreed with this plan.

## 2019-12-11 NOTE — Telephone Encounter (Signed)
Add on somewhere [employer]

## 2019-12-11 NOTE — Telephone Encounter (Signed)
Spoke with pt, Follow up scheduled  

## 2019-12-16 NOTE — Progress Notes (Signed)
Referring-Vyvyan Nancy Fetter MD Reason for referral-Chest pain  HPI: 86 yo male for evaluation of dyspnea and CP at request of Donald Prose MD. Patient states that he had Covid in December.  Since that time he has had dyspnea on exertion.  There is no orthopnea, PND, pedal edema.  He also has had fatigue.  His primary care physician increase his Synthroid dose with some improvement.  He also notes chest tightness in the left breast area.  This typically occurs with exertion but occasionally occurs at rest.  There are some radiation to his left upper extremity.  No nausea, dyspnea or diaphoresis.  Some increase with inspiration.  Cardiology now asked to evaluate.  Current Outpatient Medications  Medication Sig Dispense Refill  . acetaminophen (TYLENOL) 500 MG tablet Take 500 mg by mouth every 6 (six) hours as needed for moderate pain or headache.    . levothyroxine (SYNTHROID) 100 MCG tablet Take 100 mcg by mouth daily.    Marland Kitchen lisinopril (PRINIVIL,ZESTRIL) 10 MG tablet Take 10 mg by mouth at bedtime.    . tamsulosin (FLOMAX) 0.4 MG CAPS capsule Take 0.4 mg by mouth at bedtime.     No current facility-administered medications for this visit.    No Known Allergies   Past Medical History:  Diagnosis Date  . Biceps tendon rupture    hx of  . Cancer ([place])    thyroid  . Capsulitis of shoulder    hx of   . Hypercholesterolemia   . Hypertension     Past Surgical History:  Procedure Laterality Date  . APPENDECTOMY    . colonscopy      removal of polyps  . EYE SURGERY     left cataract removed  . right total hip replacement      2012  . THYROIDECTOMY     2001  . TONSILLECTOMY    . TOTAL HIP ARTHROPLASTY Left 06/16/2014   Procedure: LEFT TOTAL HIP ARTHROPLASTY ANTERIOR APPROACH;  Surgeon: [name], MD;  Location: [facility];  Service: Orthopedics;  Laterality: Left;  Marland Kitchen VASECTOMY      Social History   Socioeconomic History  . Marital status: Married    Spouse name: Not on file  .  Number of children: 4  . Years of education: Not on file  . Highest education level: Not on file  Occupational History  . Not on file  Tobacco Use  . Smoking status: Former Smoker    Packs/day: 0.25    Years: 10.00    Pack years: 2.50    Types: Cigarettes    Quit date: 07/05/1963    Years since quitting: 56.5  . Smokeless tobacco: Never Used  Substance and Sexual Activity  . Alcohol use: No  . Drug use: No  . Sexual activity: Not on file  Other Topics Concern  . Not on file  Social History Narrative  . Not on file   Social Determinants of Health   Financial Resource Strain:   . Difficulty of Paying Living Expenses:   Food Insecurity:   . Worried About Charity fundraiser in the Last Year:   . Arboriculturist in the Last Year:   Transportation Needs:   . Film/video editor (Medical):   Marland Kitchen Lack of Transportation (Non-Medical):   Physical Activity:   . Days of Exercise per Week:   . Minutes of Exercise per Session:   Stress:   . Feeling of Stress :   Social  Connections:   . Frequency of Communication with Friends and Family:   . Frequency of Social Gatherings with Friends and Family:   . Attends Religious Services:   . Active Member of Clubs or Organizations:   . Attends Archivist Meetings:   Marland Kitchen Marital Status:   Intimate Partner Violence:   . Fear of Current or Ex-Partner:   . Emotionally Abused:   Marland Kitchen Physically Abused:   . Sexually Abused:     Family History  Problem Relation Age of Onset  . CAD Mother   . Alzheimer's disease Father     ROS: no fevers or chills, productive cough, hemoptysis, dysphasia, odynophagia, melena, hematochezia, dysuria, hematuria, rash, seizure activity, orthopnea, PND, pedal edema, claudication. Remaining systems are negative.  Physical Exam:   Blood pressure 118/62, pulse 61, height 5\' 5"  (1.651 m), weight 140 lb 3.2 oz (63.6 kg), SpO2 95 %.  General:  Well developed/well nourished in NAD Skin warm/dry Patient not  depressed No peripheral clubbing Back-normal HEENT-normal/normal eyelids Neck supple/normal carotid upstroke bilaterally; no bruits; no JVD; no thyromegaly chest - CTA/ normal expansion CV - RRR/normal S1 and S2; no murmurs, rubs or gallops;  PMI nondisplaced Abdomen -NT/ND, no HSM, no mass, + bowel sounds, no bruit 2+ femoral pulses, no bruits Ext-no edema, chords, 2+ DP Neuro-grossly nonfocal  ECG -normal sinus rhythm, right bundle branch block.  Personally reviewed  A/P  1 chest pain-symptoms with typical and atypical features.  We will arrange a [place] nuclear study for risk stratification.  2 dyspnea-not volume overloaded on examination.  Given history of Covid will arrange a D-dimer to screen for pulmonary embolus.  We will also check chest x-ray to rule out postinflammatory issues from Covid.  Check echocardiogram for LV function.  3 hypertension-patient's blood pressure is controlled.  Continue present medical regimen.  Kirk Ruths, MD

## 2019-12-23 ENCOUNTER — Encounter: Payer: Self-pay | Admitting: Cardiology

## 2019-12-23 ENCOUNTER — Ambulatory Visit (INDEPENDENT_AMBULATORY_CARE_PROVIDER_SITE_OTHER): Payer: Medicare Other | Admitting: Cardiology

## 2019-12-23 ENCOUNTER — Other Ambulatory Visit: Payer: Self-pay

## 2019-12-23 ENCOUNTER — Ambulatory Visit
Admission: RE | Admit: 2019-12-23 | Discharge: 2019-12-23 | Disposition: A | Discharge status: Home / Self Care | Payer: Medicare Other | Source: Ambulatory Visit | Attending: Cardiology | Admitting: Cardiology

## 2019-12-23 VITALS — BP 118/62 | HR 61 | Ht 65.0 in | Wt 140.2 lb

## 2019-12-23 DIAGNOSIS — I10 Essential (primary) hypertension: Secondary | ICD-10-CM

## 2019-12-23 DIAGNOSIS — R0609 Other forms of dyspnea: Secondary | ICD-10-CM

## 2019-12-23 DIAGNOSIS — R072 Precordial pain: Secondary | ICD-10-CM

## 2019-12-23 DIAGNOSIS — R0600 Dyspnea, unspecified: Secondary | ICD-10-CM

## 2019-12-23 NOTE — Patient Instructions (Signed)
Medication Instructions:  NO CHANGE *If you need a refill on your cardiac medications before your next appointment, please call your pharmacy*   Lab Work: Your physician recommends that you HAVE LAB WORK TODAY If you have labs (blood work) drawn today and your tests are completely normal, you will receive your results only by: Marland Kitchen MyChart Message (if you have MyChart) OR . A paper copy in the mail If you have any lab test that is abnormal or we need to change your treatment, we will call you to review the results.   Testing/Procedures: Your physician has requested that you have an echocardiogram. Echocardiography is a painless test that uses sound waves to create images of your heart. It provides your doctor with information about the size and shape of your heart and how well your heart's chambers and valves are working. This procedure takes approximately one hour. There are no restrictions for this procedure.[place] has requested that you have a lexiscan myoview. For further information please visit [email or web address]. Please follow instruction sheet, as given.[place]  A chest x-ray takes a picture of the organs and structures inside the chest, including the heart, lungs, and blood vessels. This test can show several things, including, whether the heart is enlarges; whether fluid is building up in the lungs; and whether pacemaker / defibrillator leads are still in place. [address]  Follow-Up: At [facility], you and your health needs are our priority.  As part of our continuing mission to provide you with exceptional heart care, we have created designated Provider Care Teams.  These Care Teams include your primary Cardiologist (physician) and Advanced Practice Providers (APPs -  Physician Assistants and Nurse Practitioners) who all work together to provide you with the care you need, when you need it.  We  recommend signing up for the patient portal called "MyChart".  Sign up information is provided on this After Visit Summary.  MyChart is used to connect with patients for Virtual Visits (Telemedicine).  Patients are able to view lab/test results, encounter notes, upcoming appointments, etc.  Non-urgent messages can be sent to your provider as well.   To learn more about what you can do with MyChart, go to [email or web address].    Your next appointment:

## 2019-12-24 ENCOUNTER — Ambulatory Visit
Admission: RE | Admit: 2019-12-24 | Discharge: 2019-12-24 | Disposition: A | Discharge status: Home / Self Care | Payer: Medicare Other | Source: Ambulatory Visit | Attending: Cardiology | Admitting: Cardiology

## 2019-12-24 ENCOUNTER — Telehealth: Payer: Self-pay | Admitting: *Deleted

## 2019-12-24 DIAGNOSIS — R0602 Shortness of breath: Secondary | ICD-10-CM

## 2019-12-24 DIAGNOSIS — R7989 Other specified abnormal findings of blood chemistry: Secondary | ICD-10-CM

## 2019-12-24 LAB — D-DIMER, QUANTITATIVE: D-DIMER: 1.07 mg/L FEU — ABNORMAL HIGH (ref 0.00–0.49)

## 2019-12-24 MED ORDER — IOPAMIDOL (ISOVUE-370) INJECTION 76%
60.0000 mL | Freq: Once | INTRAVENOUS | Status: AC | PRN
  Administered 2019-12-24: 60 mL via INTRAVENOUS

## 2019-12-24 NOTE — Telephone Encounter (Addendum)
-----   Message from Lelon Perla, MD sent at 12/24/2019  6:57 AM EDT ----- Arrange CTA to R/O pulmonary embolus Kirk Ruths  Spoke with pt, Aware of dr Jacalyn Lefevre recommendations. Order placed

## 2019-12-24 NOTE — Telephone Encounter (Signed)
Spoke with pt, CTA scheduled for 12/24/19 @ 11:20 am.

## 2019-12-26 ENCOUNTER — Telehealth (HOSPITAL_COMMUNITY): Payer: Self-pay | Admitting: *Deleted

## 2019-12-26 NOTE — Telephone Encounter (Signed)
Patient given detailed instructions per Myocardial Perfusion Study Information Sheet for the test on 01/01/20. Patient notified to arrive 15 minutes early and that it is imperative to arrive on time for appointment to keep from having the test rescheduled. ° If you need to cancel or reschedule your appointment, please call the office within 24 hours of your appointment. . Patient verbalized understanding. Foster Sonnier Jacqueline ° ° ° ° ° °

## 2019-12-30 NOTE — Progress Notes (Signed)
HPI: Follow-up dyspnea and chest pain.  Patient recently seen with the above symptoms.  CTA showed no pulmonary embolus but there was note of aortic atherosclerosis.  Echocardiogram showed normal LV function, grade 1 diastolic dysfunction, mild mitral regurgitation, mild aortic insufficiency and mildly dilated ascending aorta (42 mm).  Nuclear study showed ejection fraction 55%, attenuation artifact but no ischemia.  Since last seen his symptoms have improved.  He has mild dyspnea on exertion.  He occasionally has chest tightness in the left chest area not related to exertion and last 10 minutes and resolves.  It is improved compared to previous.  No syncope.  Current Outpatient Medications  Medication Sig Dispense Refill  . acetaminophen (TYLENOL) 500 MG tablet Take 500 mg by mouth every 6 (six) hours as needed for moderate pain or headache.    . levothyroxine (SYNTHROID) 100 MCG tablet Take 100 mcg by mouth daily.    Marland Kitchen lisinopril (PRINIVIL,ZESTRIL) 10 MG tablet Take 10 mg by mouth at bedtime.    . tamsulosin (FLOMAX) 0.4 MG CAPS capsule Take 0.4 mg by mouth at bedtime.     No current facility-administered medications for this visit.     Past Medical History:  Diagnosis Date  . Biceps tendon rupture    hx of  . Cancer ([place])    thyroid  . Capsulitis of shoulder    hx of   . Hypercholesterolemia   . Hypertension     Past Surgical History:  Procedure Laterality Date  . APPENDECTOMY    . colonscopy      removal of polyps  . EYE SURGERY     left cataract removed  . right total hip replacement      2012  . THYROIDECTOMY     2001  . TONSILLECTOMY    . TOTAL HIP ARTHROPLASTY Left 06/16/2014   Procedure: LEFT TOTAL HIP ARTHROPLASTY ANTERIOR APPROACH;  Surgeon: [name], MD;  Location: [facility];  Service: Orthopedics;  Laterality: Left;  Marland Kitchen VASECTOMY      Social History   Socioeconomic History  . Marital status: Married    Spouse name: Not on file  . Number of  children: 4  . Years of education: Not on file  . Highest education level: Not on file  Occupational History  . Not on file  Tobacco Use  . Smoking status: Former Smoker    Packs/day: 0.25    Years: 10.00    Pack years: 2.50    Types: Cigarettes    Quit date: 07/05/1963    Years since quitting: 56.5  . Smokeless tobacco: Never Used  Substance and Sexual Activity  . Alcohol use: No  . Drug use: No  . Sexual activity: Not on file  Other Topics Concern  . Not on file  Social History Narrative  . Not on file   Social Determinants of Health   Financial Resource Strain:   . Difficulty of Paying Living Expenses:   Food Insecurity:   . Worried About Charity fundraiser in the Last Year:   . Arboriculturist in the Last Year:   Transportation Needs:   . Film/video editor (Medical):   Marland Kitchen Lack of Transportation (Non-Medical):   Physical Activity:   . Days of Exercise per Week:   . Minutes of Exercise per Session:   Stress:   . Feeling of Stress :   Social Connections:   . Frequency of Communication with Friends and Family:   .  Frequency of Social Gatherings with Friends and Family:   . Attends Religious Services:   . Active Member of Clubs or Organizations:   . Attends Archivist Meetings:   Marland Kitchen Marital Status:   Intimate Partner Violence:   . Fear of Current or Ex-Partner:   . Emotionally Abused:   Marland Kitchen Physically Abused:   . Sexually Abused:     Family History  Problem Relation Age of Onset  . CAD Mother   . Alzheimer's disease Father     ROS: no fevers or chills, productive cough, hemoptysis, dysphasia, odynophagia, melena, hematochezia, dysuria, hematuria, rash, seizure activity, orthopnea, PND, pedal edema, claudication. Remaining systems are negative.  Physical Exam: Well-developed well-nourished in no acute distress.  Skin is warm and dry.  HEENT is normal.  Neck is supple.  Chest is clear to auscultation with normal expansion.  Cardiovascular exam  is regular rate and rhythm.  Abdominal exam nontender or distended. No masses palpated. Extremities show no edema. neuro grossly intact  A/P  1 chest pain-symptoms are improving and are somewhat atypical.  Nuclear study showed no ischemia.  We will continue observation for now.  2 dyspnea-he feels as though symptoms are improving.  Echocardiogram shows normal LV function.  CTA showed no pulmonary embolus.  Continue to follow.  3 hypertension-blood pressure controlled.  Continue present medications.  Kirk Ruths, MD

## 2020-01-01 ENCOUNTER — Other Ambulatory Visit: Payer: Self-pay

## 2020-01-01 ENCOUNTER — Ambulatory Visit (HOSPITAL_COMMUNITY): Payer: Medicare Other | Attending: Internal Medicine

## 2020-01-01 DIAGNOSIS — R0600 Dyspnea, unspecified: Secondary | ICD-10-CM | POA: Diagnosis present

## 2020-01-01 DIAGNOSIS — R072 Precordial pain: Secondary | ICD-10-CM

## 2020-01-01 DIAGNOSIS — R0609 Other forms of dyspnea: Secondary | ICD-10-CM

## 2020-01-01 LAB — MYOCARDIAL PERFUSION IMAGING
LV dias vol: 87 mL (ref 62–150)
LV sys vol: 39 mL
Peak HR: 98 {beats}/min
Rest HR: 67 {beats}/min
SDS: 0
SRS: 0
SSS: 0
TID: 1.06

## 2020-01-01 MED ORDER — TECHNETIUM TC 99M TETROFOSMIN IV KIT
10.9000 | PACK | Freq: Once | INTRAVENOUS | Status: AC | PRN
  Administered 2020-01-01: 10.9 via INTRAVENOUS
  Filled 2020-01-01: qty 11

## 2020-01-01 MED ORDER — REGADENOSON 0.4 MG/5ML IV SOLN
0.4000 mg | Freq: Once | INTRAVENOUS | Status: AC
  Administered 2020-01-01: 0.4 mg via INTRAVENOUS

## 2020-01-01 MED ORDER — TECHNETIUM TC 99M TETROFOSMIN IV KIT
32.4000 | PACK | Freq: Once | INTRAVENOUS | Status: AC | PRN
  Administered 2020-01-01: 32.4 via INTRAVENOUS
  Filled 2020-01-01: qty 33

## 2020-01-02 ENCOUNTER — Ambulatory Visit (HOSPITAL_COMMUNITY): Payer: Medicare Other | Attending: Cardiology

## 2020-01-02 DIAGNOSIS — R0600 Dyspnea, unspecified: Secondary | ICD-10-CM | POA: Insufficient documentation

## 2020-01-02 DIAGNOSIS — R072 Precordial pain: Secondary | ICD-10-CM | POA: Insufficient documentation

## 2020-01-02 DIAGNOSIS — R0609 Other forms of dyspnea: Secondary | ICD-10-CM

## 2020-01-07 ENCOUNTER — Ambulatory Visit: Payer: Medicare Other | Admitting: Cardiology

## 2020-01-07 ENCOUNTER — Ambulatory Visit (INDEPENDENT_AMBULATORY_CARE_PROVIDER_SITE_OTHER): Payer: Medicare Other | Admitting: Cardiology

## 2020-01-07 ENCOUNTER — Encounter: Payer: Self-pay | Admitting: Cardiology

## 2020-01-07 ENCOUNTER — Other Ambulatory Visit: Payer: Self-pay

## 2020-01-07 VITALS — BP 120/66 | HR 71 | Ht 65.0 in | Wt 139.6 lb

## 2020-01-07 DIAGNOSIS — I10 Essential (primary) hypertension: Secondary | ICD-10-CM | POA: Diagnosis not present

## 2020-01-07 DIAGNOSIS — R072 Precordial pain: Secondary | ICD-10-CM | POA: Diagnosis not present

## 2020-01-07 DIAGNOSIS — R0602 Shortness of breath: Secondary | ICD-10-CM | POA: Diagnosis not present

## 2020-01-07 NOTE — Patient Instructions (Signed)

## 2020-01-15 DIAGNOSIS — E890 Postprocedural hypothyroidism: Secondary | ICD-10-CM | POA: Diagnosis not present

## 2020-01-27 ENCOUNTER — Ambulatory Visit: Payer: Medicare Other | Admitting: Cardiology

## 2020-05-13 DIAGNOSIS — I129 Hypertensive chronic kidney disease with stage 1 through stage 4 chronic kidney disease, or unspecified chronic kidney disease: Secondary | ICD-10-CM | POA: Diagnosis not present

## 2020-05-13 DIAGNOSIS — N1832 Chronic kidney disease, stage 3b: Secondary | ICD-10-CM | POA: Diagnosis not present

## 2020-05-13 DIAGNOSIS — E890 Postprocedural hypothyroidism: Secondary | ICD-10-CM | POA: Diagnosis not present

## 2020-05-25 DIAGNOSIS — D1801 Hemangioma of skin and subcutaneous tissue: Secondary | ICD-10-CM | POA: Diagnosis not present

## 2020-05-25 DIAGNOSIS — D485 Neoplasm of uncertain behavior of skin: Secondary | ICD-10-CM | POA: Diagnosis not present

## 2020-05-25 DIAGNOSIS — L814 Other melanin hyperpigmentation: Secondary | ICD-10-CM | POA: Diagnosis not present

## 2020-05-25 DIAGNOSIS — L819 Disorder of pigmentation, unspecified: Secondary | ICD-10-CM | POA: Diagnosis not present

## 2020-05-25 DIAGNOSIS — C44729 Squamous cell carcinoma of skin of left lower limb, including hip: Secondary | ICD-10-CM | POA: Diagnosis not present

## 2020-05-25 DIAGNOSIS — L570 Actinic keratosis: Secondary | ICD-10-CM | POA: Diagnosis not present

## 2020-05-25 DIAGNOSIS — L821 Other seborrheic keratosis: Secondary | ICD-10-CM | POA: Diagnosis not present

## 2020-05-25 DIAGNOSIS — Z85828 Personal history of other malignant neoplasm of skin: Secondary | ICD-10-CM | POA: Diagnosis not present

## 2020-05-25 DIAGNOSIS — D229 Melanocytic nevi, unspecified: Secondary | ICD-10-CM | POA: Diagnosis not present

## 2020-05-25 DIAGNOSIS — L905 Scar conditions and fibrosis of skin: Secondary | ICD-10-CM | POA: Diagnosis not present

## 2020-07-15 DIAGNOSIS — C44729 Squamous cell carcinoma of skin of left lower limb, including hip: Secondary | ICD-10-CM | POA: Diagnosis not present

## 2020-07-21 NOTE — Progress Notes (Signed)
HPI: Follow-up dyspnea and chest pain.  Patient recently seen with the above symptoms.  CTA showed no pulmonary embolus but there was note of aortic atherosclerosis.  Echocardiogram 7/21 showed normal LV function, grade 1 diastolic dysfunction, mild mitral regurgitation, mild aortic insufficiency and mildly dilated ascending aorta (42 mm).  Nuclear study 7/21 showed ejection fraction 55%, attenuation artifact but no ischemia.  Since last seen he denies dyspnea.  He has pain in his left shoulder not related to activities.  He has injured that shoulder in the past and feels this is what it is.  He denies exertional chest pain.  He has not had syncope.  Current Outpatient Medications  Medication Sig Dispense Refill  . acetaminophen (TYLENOL) 500 MG tablet Take 500 mg by mouth every 6 (six) hours as needed for moderate pain or headache.    . levothyroxine (SYNTHROID) 100 MCG tablet Take 100 mcg by mouth daily.    Marland Kitchen lisinopril (PRINIVIL,ZESTRIL) 10 MG tablet Take 10 mg by mouth at bedtime.    . tamsulosin (FLOMAX) 0.4 MG CAPS capsule Take 0.4 mg by mouth at bedtime.     No current facility-administered medications for this visit.     Past Medical History:  Diagnosis Date  . Biceps tendon rupture    hx of  . Cancer ([place])    thyroid  . Capsulitis of shoulder    hx of   . Hypercholesterolemia   . Hypertension     Past Surgical History:  Procedure Laterality Date  . APPENDECTOMY    . colonscopy      removal of polyps  . EYE SURGERY     left cataract removed  . right total hip replacement      2012  . THYROIDECTOMY     2001  . TONSILLECTOMY    . TOTAL HIP ARTHROPLASTY Left 06/16/2014   Procedure: LEFT TOTAL HIP ARTHROPLASTY ANTERIOR APPROACH;  Surgeon: [name], MD;  Location: [facility];  Service: Orthopedics;  Laterality: Left;  Marland Kitchen VASECTOMY      Social History   Socioeconomic History  . Marital status: Married    Spouse name: Not on file  . Number of children: 4   . Years of education: Not on file  . Highest education level: Not on file  Occupational History  . Not on file  Tobacco Use  . Smoking status: Former Smoker    Packs/day: 0.25    Years: 10.00    Pack years: 2.50    Types: Cigarettes    Quit date: 07/05/1963    Years since quitting: 57.1  . Smokeless tobacco: Never Used  Substance and Sexual Activity  . Alcohol use: No  . Drug use: No  . Sexual activity: Not on file  Other Topics Concern  . Not on file  Social History Narrative  . Not on file   Social Determinants of Health   Financial Resource Strain: Not on file  Food Insecurity: Not on file  Transportation Needs: Not on file  Physical Activity: Not on file  Stress: Not on file  Social Connections: Not on file  Intimate Partner Violence: Not on file    Family History  Problem Relation Age of Onset  . CAD Mother   . Alzheimer's disease Father     ROS: no fevers or chills, productive cough, hemoptysis, dysphasia, odynophagia, melena, hematochezia, dysuria, hematuria, rash, seizure activity, orthopnea, PND, pedal edema, claudication. Remaining systems are negative.  Physical Exam: Well-developed well-nourished  in no acute distress.  Skin is warm and dry.  HEENT is normal.  Neck is supple.  Chest is clear to auscultation with normal expansion.  Cardiovascular exam is regular rate and rhythm.  Abdominal exam nontender or distended. No masses palpated. Extremities show no edema. neuro grossly intact  A/P  1 chest pain-patient has occasional left shoulder pain unrelated to activities that does not sound cardiac.  He also injured the shoulder in the past and feels is likely related to this.  Previous nuclear study showed no ischemia.  No plans for further evaluation at this point.  2 dyspnea-symptoms continue to improve.  Previous CTA showed no pulmonary embolus and echocardiogram showed normal LV function.  3 hypertension-blood pressure controlled.  Continue  present medications and follow.  Kirk Ruths, MD

## 2020-07-27 ENCOUNTER — Encounter: Payer: Self-pay | Admitting: Cardiology

## 2020-07-27 ENCOUNTER — Other Ambulatory Visit: Payer: Self-pay

## 2020-07-27 ENCOUNTER — Ambulatory Visit (INDEPENDENT_AMBULATORY_CARE_PROVIDER_SITE_OTHER): Payer: Medicare Other | Admitting: Cardiology

## 2020-07-27 VITALS — BP 123/60 | HR 79 | Ht 66.0 in | Wt 139.6 lb

## 2020-07-27 DIAGNOSIS — R072 Precordial pain: Secondary | ICD-10-CM | POA: Diagnosis not present

## 2020-07-27 DIAGNOSIS — I10 Essential (primary) hypertension: Secondary | ICD-10-CM | POA: Diagnosis not present

## 2020-07-27 DIAGNOSIS — R0602 Shortness of breath: Secondary | ICD-10-CM | POA: Diagnosis not present

## 2020-07-27 NOTE — Patient Instructions (Signed)

## 2020-09-22 DIAGNOSIS — M25462 Effusion, left knee: Secondary | ICD-10-CM | POA: Diagnosis not present

## 2020-09-22 DIAGNOSIS — M1712 Unilateral primary osteoarthritis, left knee: Secondary | ICD-10-CM | POA: Diagnosis not present

## 2020-10-09 DIAGNOSIS — M25462 Effusion, left knee: Secondary | ICD-10-CM | POA: Diagnosis not present

## 2020-10-16 DIAGNOSIS — M25462 Effusion, left knee: Secondary | ICD-10-CM | POA: Diagnosis not present

## 2020-10-20 IMAGING — CR DG CHEST 2V
2 series · 2 of 2 positions shown · non-contrast
Comparison: Radiographs June 10, 2014.

CLINICAL DATA: Dry cough.

EXAM:
CHEST - 2 VIEW

[w chest pa]
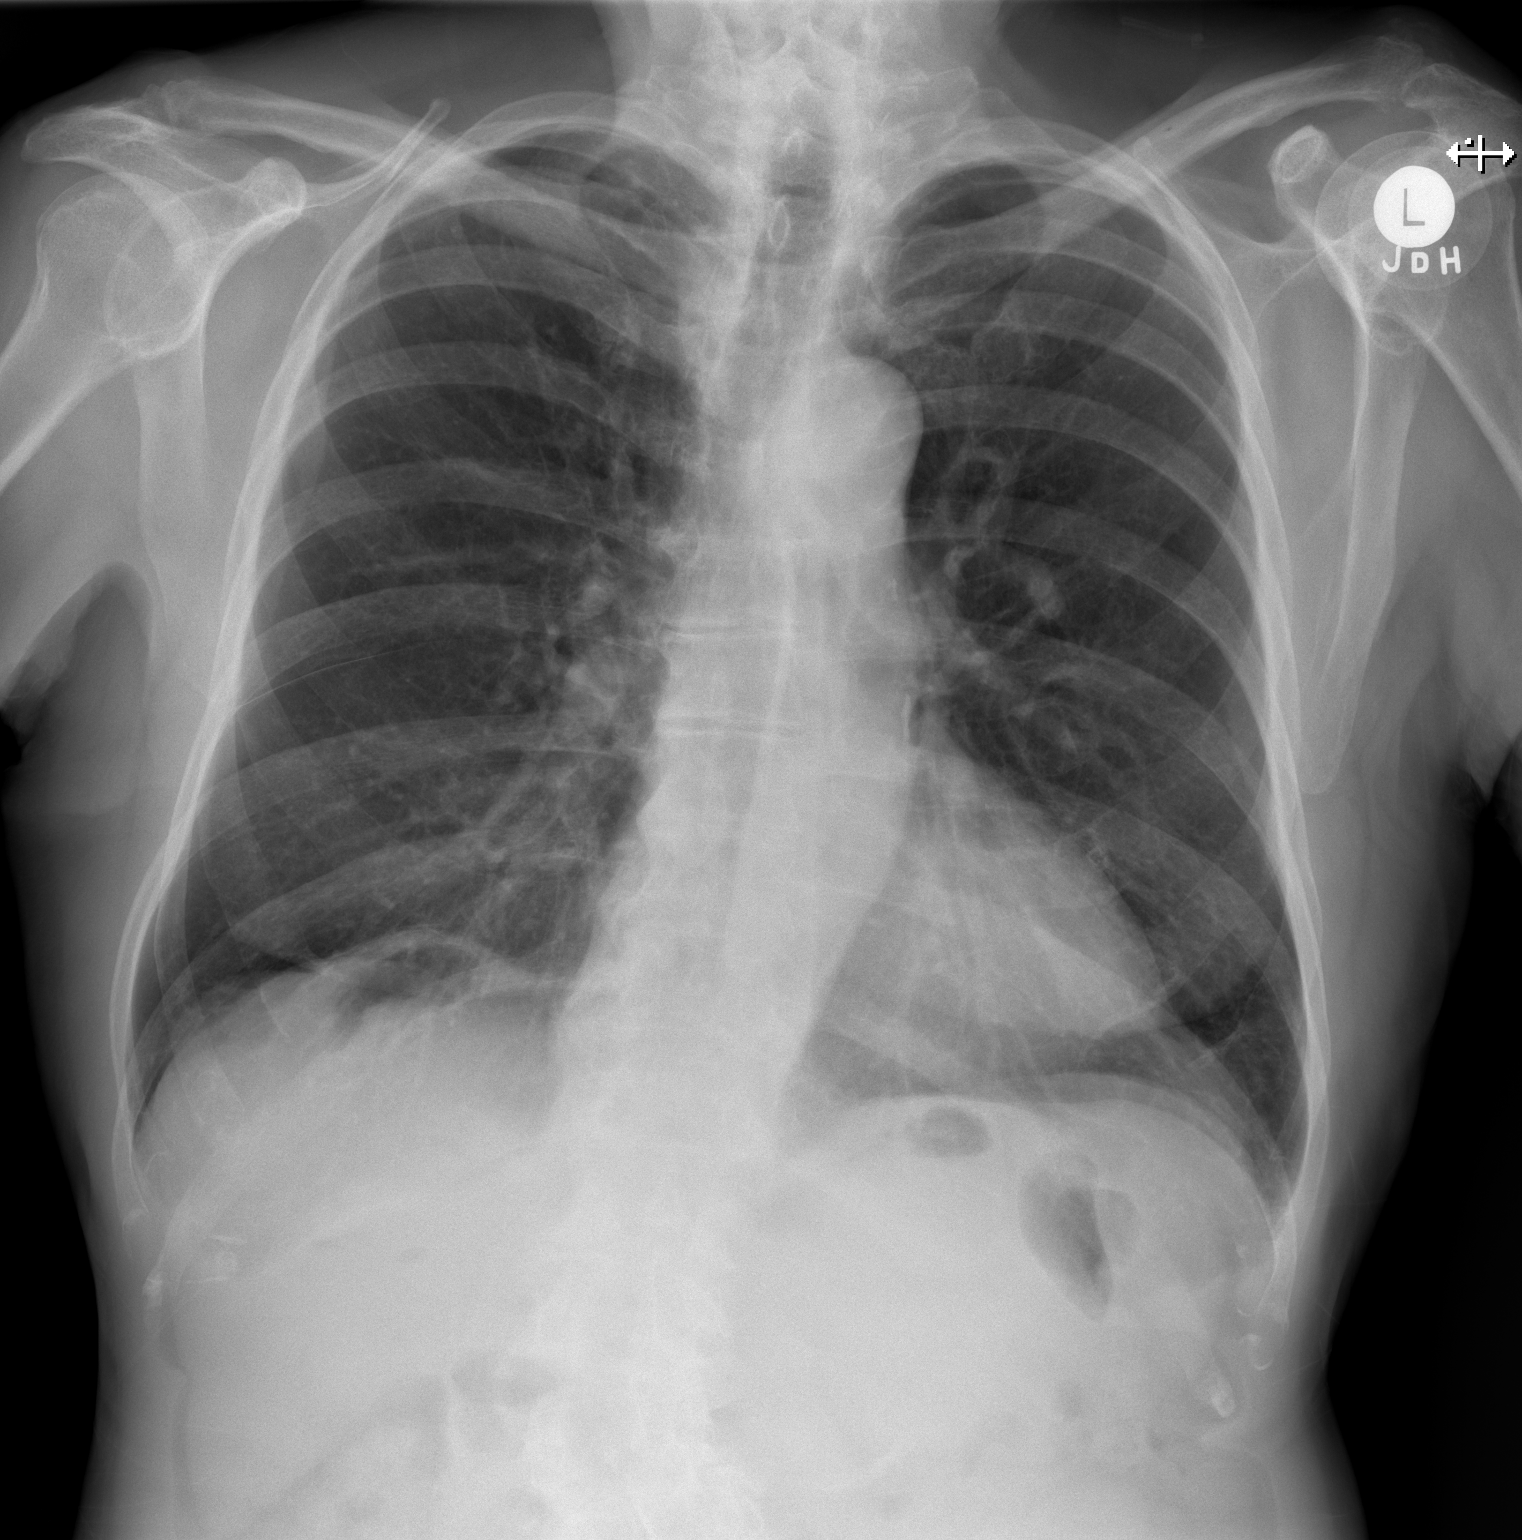

[w chest lat]
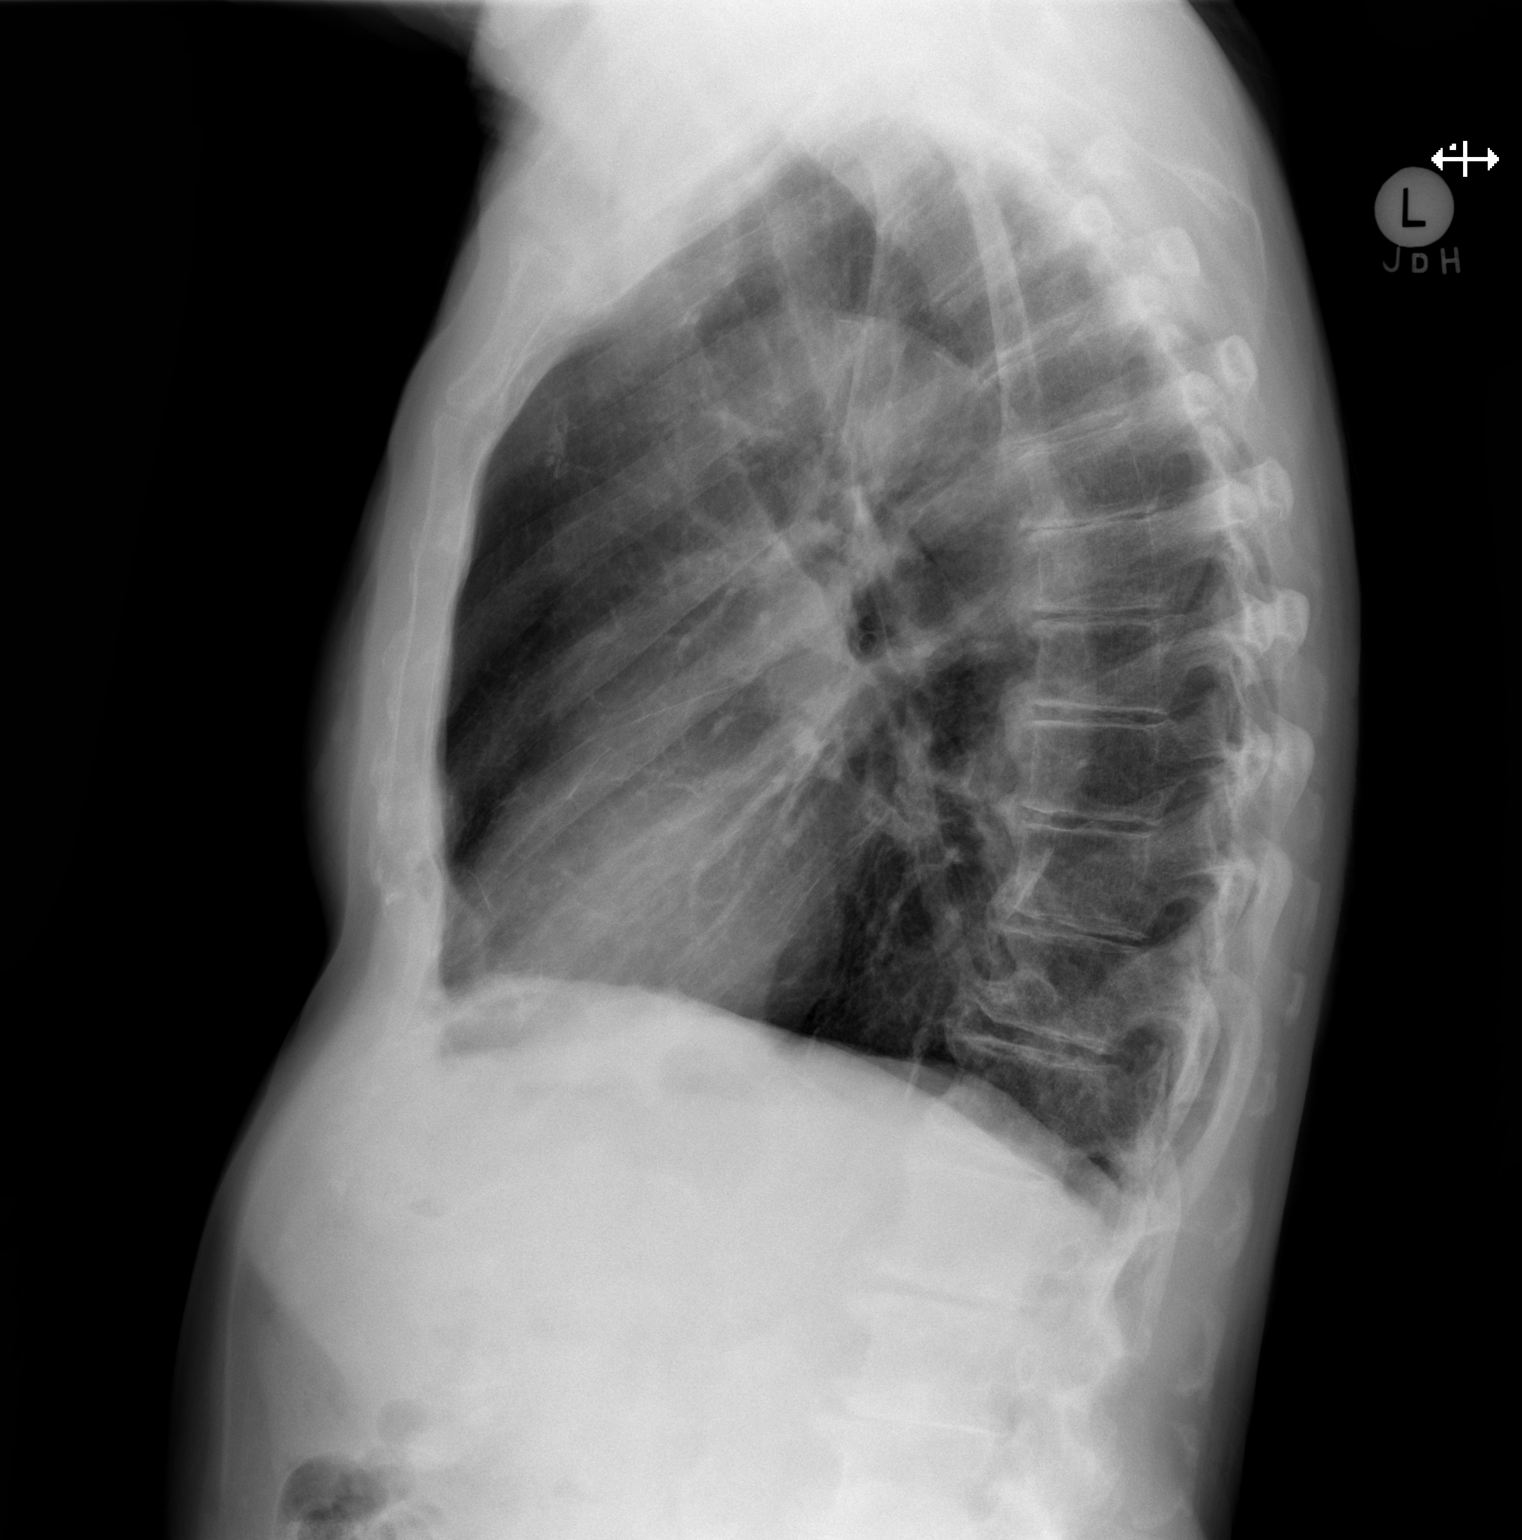

[2 of 2 positions shown; findings below may reference images not displayed]

FINDINGS: The heart size and mediastinal contours are within normal limits.
Both lungs are clear. The visualized skeletal structures are
unremarkable.
IMPRESSION: No active cardiopulmonary disease.

## 2020-10-21 DIAGNOSIS — E890 Postprocedural hypothyroidism: Secondary | ICD-10-CM | POA: Diagnosis not present

## 2020-10-26 DIAGNOSIS — M1712 Unilateral primary osteoarthritis, left knee: Secondary | ICD-10-CM | POA: Diagnosis not present

## 2020-11-02 DIAGNOSIS — M1712 Unilateral primary osteoarthritis, left knee: Secondary | ICD-10-CM | POA: Diagnosis not present

## 2020-11-05 DIAGNOSIS — N5201 Erectile dysfunction due to arterial insufficiency: Secondary | ICD-10-CM | POA: Diagnosis not present

## 2020-11-05 DIAGNOSIS — N401 Enlarged prostate with lower urinary tract symptoms: Secondary | ICD-10-CM | POA: Diagnosis not present

## 2020-11-05 DIAGNOSIS — R350 Frequency of micturition: Secondary | ICD-10-CM | POA: Diagnosis not present

## 2020-11-09 DIAGNOSIS — M1712 Unilateral primary osteoarthritis, left knee: Secondary | ICD-10-CM | POA: Diagnosis not present

## 2020-11-18 DIAGNOSIS — Z1389 Encounter for screening for other disorder: Secondary | ICD-10-CM | POA: Diagnosis not present

## 2020-11-18 DIAGNOSIS — E890 Postprocedural hypothyroidism: Secondary | ICD-10-CM | POA: Diagnosis not present

## 2020-11-18 DIAGNOSIS — N400 Enlarged prostate without lower urinary tract symptoms: Secondary | ICD-10-CM | POA: Diagnosis not present

## 2020-11-18 DIAGNOSIS — I129 Hypertensive chronic kidney disease with stage 1 through stage 4 chronic kidney disease, or unspecified chronic kidney disease: Secondary | ICD-10-CM | POA: Diagnosis not present

## 2020-11-18 DIAGNOSIS — E7800 Pure hypercholesterolemia, unspecified: Secondary | ICD-10-CM | POA: Diagnosis not present

## 2020-11-18 DIAGNOSIS — I700 Atherosclerosis of aorta: Secondary | ICD-10-CM | POA: Diagnosis not present

## 2020-11-18 DIAGNOSIS — N1832 Chronic kidney disease, stage 3b: Secondary | ICD-10-CM | POA: Diagnosis not present

## 2020-11-18 DIAGNOSIS — Z0000 Encounter for general adult medical examination without abnormal findings: Secondary | ICD-10-CM | POA: Diagnosis not present

## 2020-11-23 DIAGNOSIS — L905 Scar conditions and fibrosis of skin: Secondary | ICD-10-CM | POA: Diagnosis not present

## 2020-11-23 DIAGNOSIS — Z85828 Personal history of other malignant neoplasm of skin: Secondary | ICD-10-CM | POA: Diagnosis not present

## 2020-11-23 DIAGNOSIS — L570 Actinic keratosis: Secondary | ICD-10-CM | POA: Diagnosis not present

## 2020-12-22 DIAGNOSIS — L720 Epidermal cyst: Secondary | ICD-10-CM | POA: Diagnosis not present

## 2020-12-22 DIAGNOSIS — L538 Other specified erythematous conditions: Secondary | ICD-10-CM | POA: Diagnosis not present

## 2021-01-18 DIAGNOSIS — R198 Other specified symptoms and signs involving the digestive system and abdomen: Secondary | ICD-10-CM | POA: Diagnosis not present

## 2021-04-06 DIAGNOSIS — Z08 Encounter for follow-up examination after completed treatment for malignant neoplasm: Secondary | ICD-10-CM | POA: Diagnosis not present

## 2021-04-06 DIAGNOSIS — Z85828 Personal history of other malignant neoplasm of skin: Secondary | ICD-10-CM | POA: Diagnosis not present

## 2021-04-06 DIAGNOSIS — L728 Other follicular cysts of the skin and subcutaneous tissue: Secondary | ICD-10-CM | POA: Diagnosis not present

## 2021-04-21 NOTE — Progress Notes (Signed)
HPI: Follow-up dyspnea and chest pain. CTA showed no pulmonary embolus but there was note of aortic atherosclerosis. Echocardiogram 7/21 showed normal LV function, grade 1 diastolic dysfunction, mild mitral regurgitation, mild aortic insufficiency and mildly dilated ascending aorta (42 mm). Nuclear study 7/21 showed ejection fraction 55%, attenuation artifact but no ischemia.  Since last seen he has dyspnea with more vigorous activities but not routine activities.  No orthopnea, PND, pedal edema, chest pain or syncope.  Current Outpatient Medications  Medication Sig Dispense Refill   acetaminophen (TYLENOL) 500 MG tablet Take 500 mg by mouth every 6 (six) hours as needed for moderate pain or headache.     levothyroxine (SYNTHROID) 100 MCG tablet Take 100 mcg by mouth daily.     lisinopril (PRINIVIL,ZESTRIL) 10 MG tablet Take 10 mg by mouth at bedtime.     tamsulosin (FLOMAX) 0.4 MG CAPS capsule Take 0.4 mg by mouth at bedtime.     No current facility-administered medications for this visit.     Past Medical History:  Diagnosis Date   Biceps tendon rupture    hx of   Cancer ([facility])    thyroid   Capsulitis of shoulder    hx of    Hypercholesterolemia    Hypertension     Past Surgical History:  Procedure Laterality Date   APPENDECTOMY     colonscopy      removal of polyps   EYE SURGERY     left cataract removed   right total hip replacement      2012   THYROIDECTOMY     2001   TONSILLECTOMY     TOTAL HIP ARTHROPLASTY Left 06/16/2014   Procedure: LEFT TOTAL HIP ARTHROPLASTY ANTERIOR APPROACH;  Surgeon: [name], MD;  Location: [facility];  Service: Orthopedics;  Laterality: Left;   VASECTOMY      Social History   Socioeconomic History   Marital status: Married    Spouse name: Not on file   Number of children: 4   Years of education: Not on file   Highest education level: Not on file  Occupational History   Not on file  Tobacco Use   Smoking status: Former     Packs/day: 0.25    Years: 10.00    Pack years: 2.50    Types: Cigarettes    Quit date: 07/05/1963    Years since quitting: 57.8   Smokeless tobacco: Never  Substance and Sexual Activity   Alcohol use: No   Drug use: No   Sexual activity: Not on file  Other Topics Concern   Not on file  Social History Narrative   Not on file   Social Determinants of Health   Financial Resource Strain: Not on file  Food Insecurity: Not on file  Transportation Needs: Not on file  Physical Activity: Not on file  Stress: Not on file  Social Connections: Not on file  Intimate Partner Violence: Not on file    Family History  Problem Relation Age of Onset   CAD Mother    Alzheimer's disease Father     ROS: no fevers or chills, productive cough, hemoptysis, dysphasia, odynophagia, melena, hematochezia, dysuria, hematuria, rash, seizure activity, orthopnea, PND, pedal edema, claudication. Remaining systems are negative.  Physical Exam: Well-developed well-nourished in no acute distress.  Skin is warm and dry.  HEENT is normal.  Neck is supple.  Chest is clear to auscultation with normal expansion.  Cardiovascular exam is regular rate and rhythm.  Abdominal exam nontender or distended. No masses palpated. Extremities show no edema. neuro grossly intact  ECG-normal sinus rhythm at a rate of 71, right bundle branch block.  Personally reviewed  A/P  1 history of chest pain-no recurrent symptoms.  Previous functional study negative.  2 history of dyspnea-patient denies recurrent symptoms.  As outlined in previous notes CTA showed no pulmonary embolus and echocardiogram showed preserved LV function.  Nuclear study showed no ischemia.  3 hypertension-blood pressure controlled.  Continue present medical regimen.  Kirk Ruths, MD

## 2021-04-23 DIAGNOSIS — N1832 Chronic kidney disease, stage 3b: Secondary | ICD-10-CM | POA: Diagnosis not present

## 2021-04-23 DIAGNOSIS — M545 Low back pain, unspecified: Secondary | ICD-10-CM | POA: Diagnosis not present

## 2021-04-28 ENCOUNTER — Encounter: Payer: Self-pay | Admitting: Cardiology

## 2021-04-28 ENCOUNTER — Other Ambulatory Visit: Payer: Self-pay

## 2021-04-28 ENCOUNTER — Ambulatory Visit (INDEPENDENT_AMBULATORY_CARE_PROVIDER_SITE_OTHER): Payer: Medicare Other | Admitting: Cardiology

## 2021-04-28 VITALS — BP 118/60 | HR 71 | Ht 66.0 in | Wt 141.0 lb

## 2021-04-28 DIAGNOSIS — I10 Essential (primary) hypertension: Secondary | ICD-10-CM | POA: Diagnosis not present

## 2021-04-28 DIAGNOSIS — R0602 Shortness of breath: Secondary | ICD-10-CM | POA: Diagnosis not present

## 2021-04-28 DIAGNOSIS — R072 Precordial pain: Secondary | ICD-10-CM | POA: Diagnosis not present

## 2021-04-28 NOTE — Patient Instructions (Signed)

## 2021-05-02 DIAGNOSIS — Z20822 Contact with and (suspected) exposure to covid-19: Secondary | ICD-10-CM | POA: Diagnosis not present

## 2021-05-10 DIAGNOSIS — Z20822 Contact with and (suspected) exposure to covid-19: Secondary | ICD-10-CM | POA: Diagnosis not present

## 2021-05-10 DIAGNOSIS — R519 Headache, unspecified: Secondary | ICD-10-CM | POA: Diagnosis not present

## 2021-05-10 DIAGNOSIS — R051 Acute cough: Secondary | ICD-10-CM | POA: Diagnosis not present

## 2021-05-10 DIAGNOSIS — R0989 Other specified symptoms and signs involving the circulatory and respiratory systems: Secondary | ICD-10-CM | POA: Diagnosis not present

## 2021-05-10 DIAGNOSIS — U071 COVID-19: Secondary | ICD-10-CM | POA: Diagnosis not present

## 2021-05-26 DIAGNOSIS — E890 Postprocedural hypothyroidism: Secondary | ICD-10-CM | POA: Diagnosis not present

## 2021-05-26 DIAGNOSIS — I129 Hypertensive chronic kidney disease with stage 1 through stage 4 chronic kidney disease, or unspecified chronic kidney disease: Secondary | ICD-10-CM | POA: Diagnosis not present

## 2021-05-26 DIAGNOSIS — N1832 Chronic kidney disease, stage 3b: Secondary | ICD-10-CM | POA: Diagnosis not present

## 2021-06-08 DIAGNOSIS — L821 Other seborrheic keratosis: Secondary | ICD-10-CM | POA: Diagnosis not present

## 2021-06-08 DIAGNOSIS — Z08 Encounter for follow-up examination after completed treatment for malignant neoplasm: Secondary | ICD-10-CM | POA: Diagnosis not present

## 2021-06-08 DIAGNOSIS — L814 Other melanin hyperpigmentation: Secondary | ICD-10-CM | POA: Diagnosis not present

## 2021-06-08 DIAGNOSIS — D225 Melanocytic nevi of trunk: Secondary | ICD-10-CM | POA: Diagnosis not present

## 2021-06-08 DIAGNOSIS — Z85828 Personal history of other malignant neoplasm of skin: Secondary | ICD-10-CM | POA: Diagnosis not present

## 2021-06-08 DIAGNOSIS — L570 Actinic keratosis: Secondary | ICD-10-CM | POA: Diagnosis not present

## 2021-11-22 DIAGNOSIS — N400 Enlarged prostate without lower urinary tract symptoms: Secondary | ICD-10-CM | POA: Diagnosis not present

## 2021-11-22 DIAGNOSIS — Z0000 Encounter for general adult medical examination without abnormal findings: Secondary | ICD-10-CM | POA: Diagnosis not present

## 2021-11-22 DIAGNOSIS — E890 Postprocedural hypothyroidism: Secondary | ICD-10-CM | POA: Diagnosis not present

## 2021-11-22 DIAGNOSIS — Z1331 Encounter for screening for depression: Secondary | ICD-10-CM | POA: Diagnosis not present

## 2021-11-22 DIAGNOSIS — I700 Atherosclerosis of aorta: Secondary | ICD-10-CM | POA: Diagnosis not present

## 2021-11-22 DIAGNOSIS — I129 Hypertensive chronic kidney disease with stage 1 through stage 4 chronic kidney disease, or unspecified chronic kidney disease: Secondary | ICD-10-CM | POA: Diagnosis not present

## 2021-11-22 DIAGNOSIS — N1832 Chronic kidney disease, stage 3b: Secondary | ICD-10-CM | POA: Diagnosis not present

## 2021-11-22 DIAGNOSIS — E7800 Pure hypercholesterolemia, unspecified: Secondary | ICD-10-CM | POA: Diagnosis not present

## 2021-12-07 DIAGNOSIS — L814 Other melanin hyperpigmentation: Secondary | ICD-10-CM | POA: Diagnosis not present

## 2021-12-07 DIAGNOSIS — L570 Actinic keratosis: Secondary | ICD-10-CM | POA: Diagnosis not present

## 2021-12-07 DIAGNOSIS — D225 Melanocytic nevi of trunk: Secondary | ICD-10-CM | POA: Diagnosis not present

## 2021-12-07 DIAGNOSIS — L728 Other follicular cysts of the skin and subcutaneous tissue: Secondary | ICD-10-CM | POA: Diagnosis not present

## 2021-12-07 DIAGNOSIS — L821 Other seborrheic keratosis: Secondary | ICD-10-CM | POA: Diagnosis not present

## 2022-02-10 DIAGNOSIS — N401 Enlarged prostate with lower urinary tract symptoms: Secondary | ICD-10-CM | POA: Diagnosis not present

## 2022-02-10 DIAGNOSIS — R350 Frequency of micturition: Secondary | ICD-10-CM | POA: Diagnosis not present

## 2022-02-10 DIAGNOSIS — N5201 Erectile dysfunction due to arterial insufficiency: Secondary | ICD-10-CM | POA: Diagnosis not present

## 2022-04-05 IMAGING — CT CT ANGIO CHEST
1 of 2 series · 19 of 32 positions shown · IV contrast (APPLIED)
Comparison: None.

CLINICAL DATA: Shortness of breath, history of [id number]

EXAM:
CT ANGIOGRAPHY CHEST WITH CONTRAST
TECHNIQUE: Multidetector CT imaging of the chest was performed using the
standard protocol during bolus administration of intravenous
contrast. Multiplanar CT image reconstructions and MIPs were
obtained to evaluate the vascular anatomy.
CONTRAST:  60mL Y4JURK-2B1 IOPAMIDOL (Y4JURK-2B1) INJECTION 76%

[Series 5: thins 1.0 b31s · axial · 0.72mm/px · z∈[-277,-21]mm · 19 of 286 slices shown]
[im 15/286  lung]
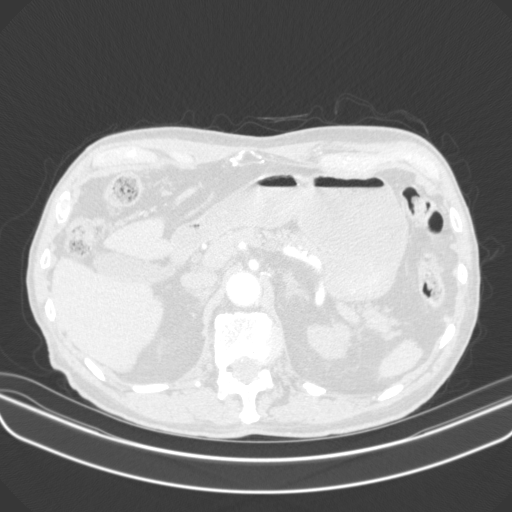
[im 29/286  mediastinal]
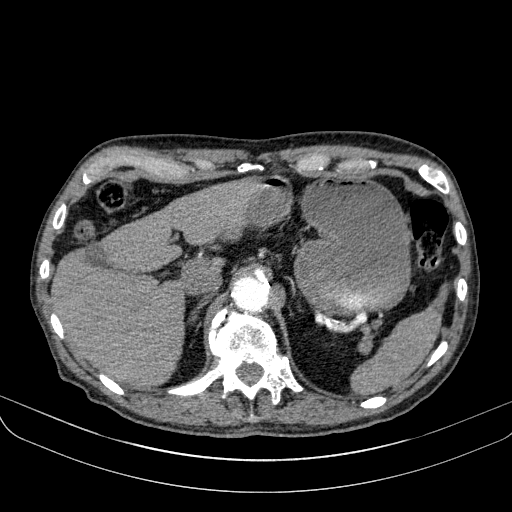
[im 43/286  lung]
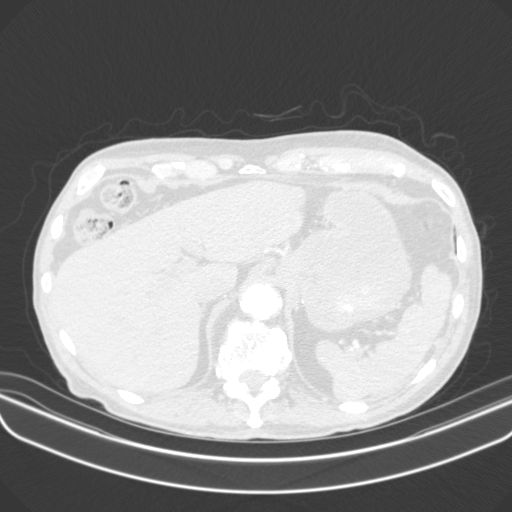
[im 72/286  mediastinal]
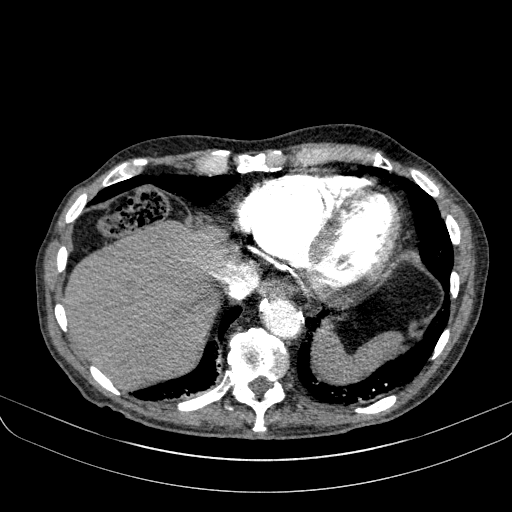
[im 86/286  lung]
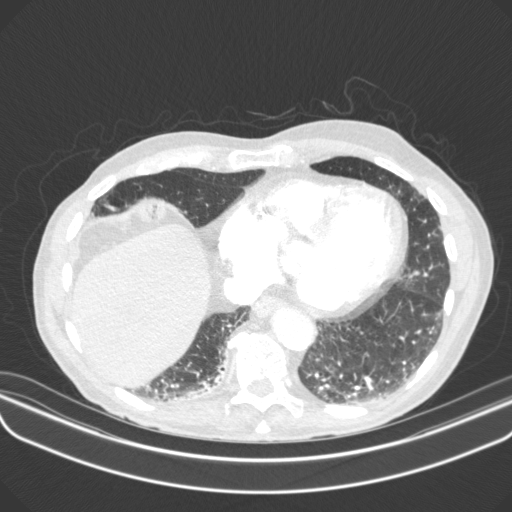
[im 96/286  mediastinal]
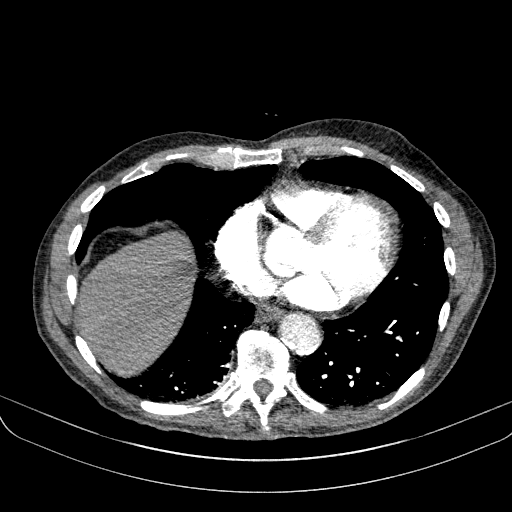
[im 100/286  lung]
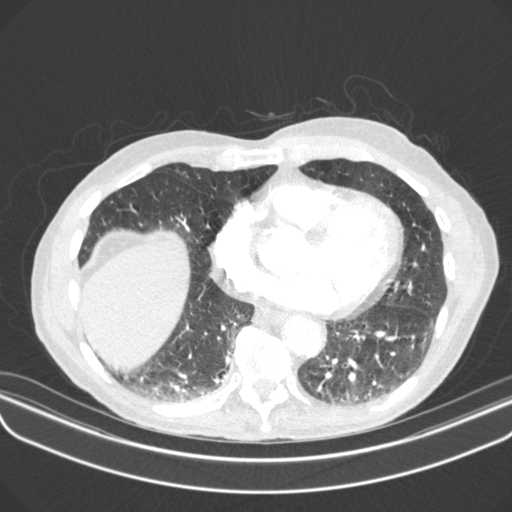
[im 115/286  mediastinal]
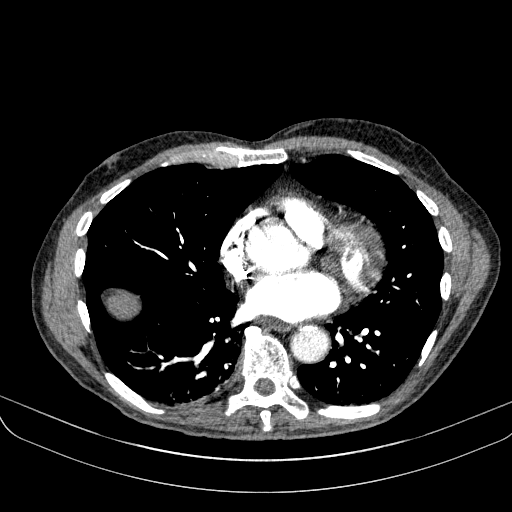
[im 129/286  lung]
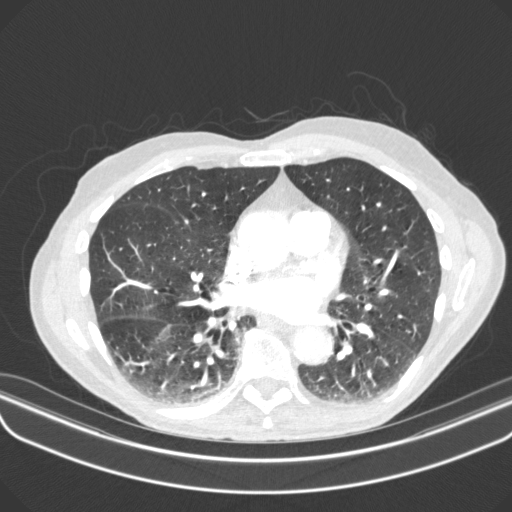
[im 143/286  mediastinal]
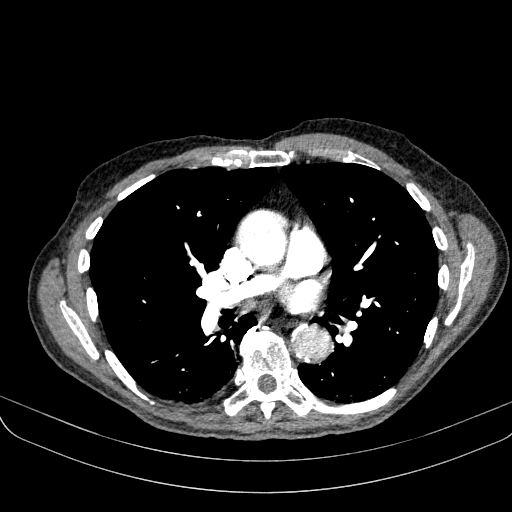
[im 157/286  lung]
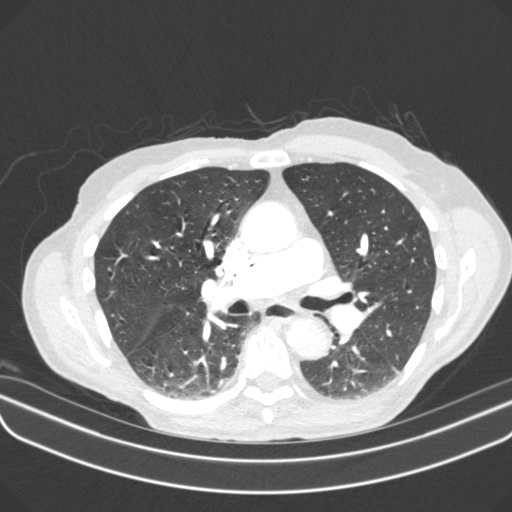
[im 172/286  mediastinal]
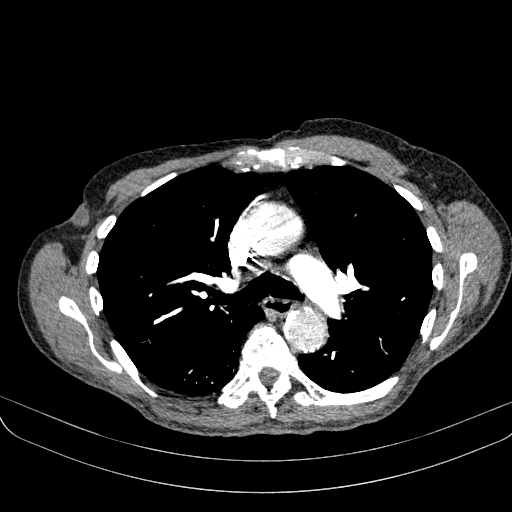
[im 186/286  lung]
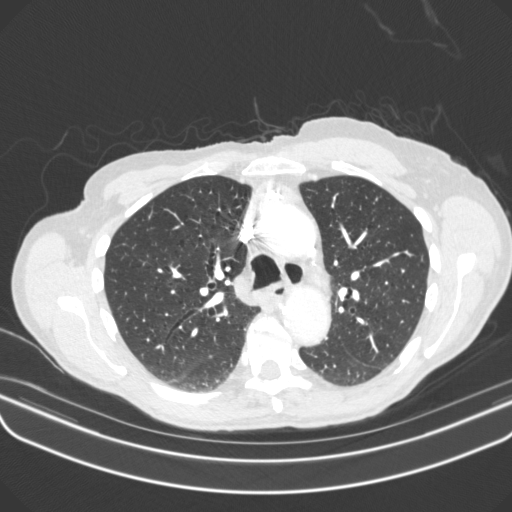
[im 191/286  mediastinal]
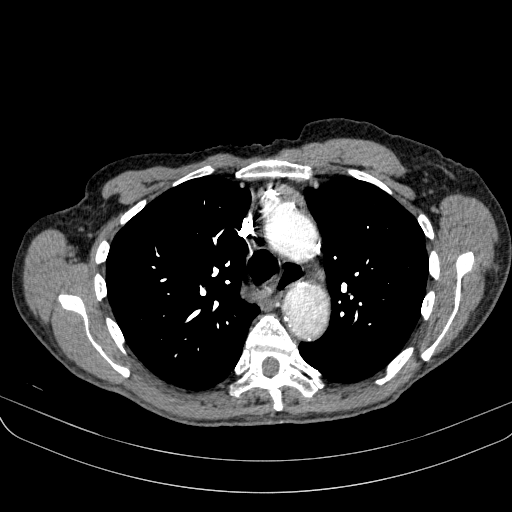
[im 200/286  lung]
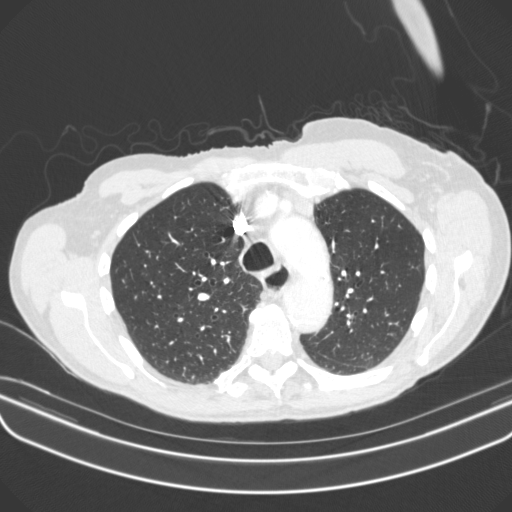
[im 214/286  mediastinal]
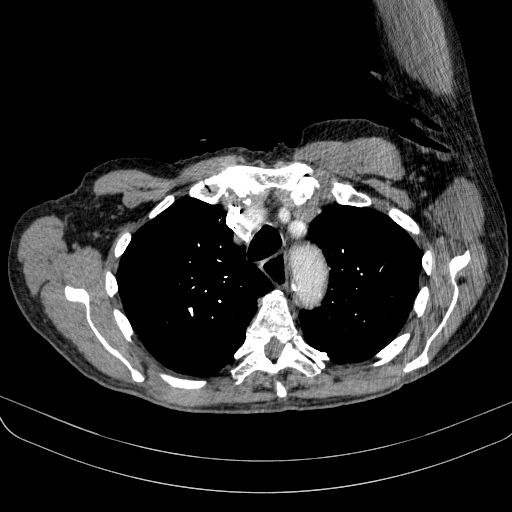
[im 243/286  lung]
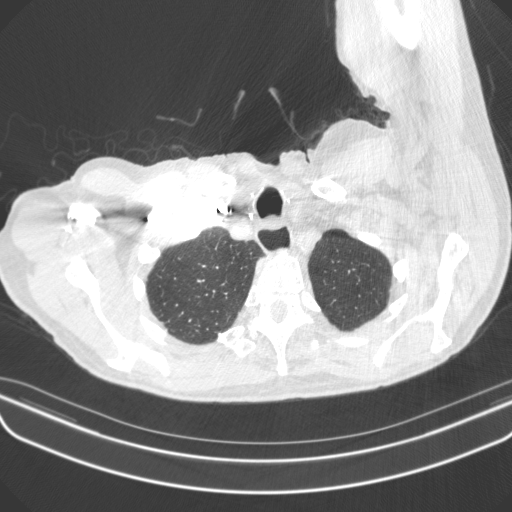
[im 257/286  mediastinal]
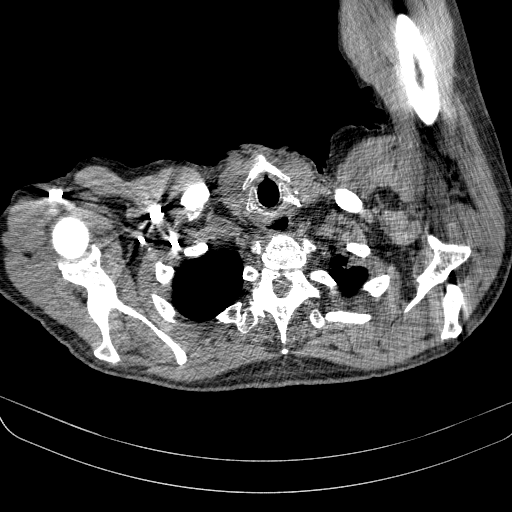
[im 271/286  lung]
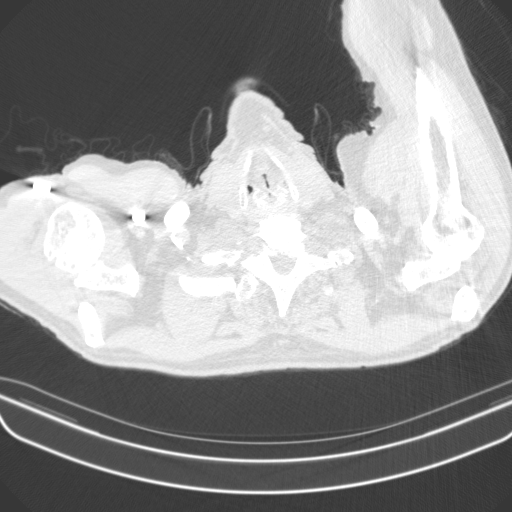

[19 of 32 positions shown; findings below may reference images not displayed]

FINDINGS: Cardiovascular: Satisfactory opacification of the pulmonary arteries
to the segmental level. No evidence of pulmonary embolism. Normal
heart size. No pericardial effusion. Thoracic aortic
atherosclerosis.

Mediastinum/Nodes: No enlarged mediastinal, hilar, or axillary lymph
nodes. Trachea and esophagus demonstrate no significant findings.
Prior thyroidectomy.

Lungs/Pleura: Lungs are clear. No pleural effusion or pneumothorax.

Upper Abdomen: No acute abnormality.

Musculoskeletal: No acute osseous abnormality. No aggressive osseous
lesion. Diffuse mild thoracic spine spondylosis.

Review of the MIP images confirms the above findings.
IMPRESSION: 1. No evidence of pulmonary embolus.
2. No acute cardiopulmonary disease.
3. Aortic Atherosclerosis ([id number]).

## 2022-06-08 DIAGNOSIS — D225 Melanocytic nevi of trunk: Secondary | ICD-10-CM | POA: Diagnosis not present

## 2022-06-08 DIAGNOSIS — L821 Other seborrheic keratosis: Secondary | ICD-10-CM | POA: Diagnosis not present

## 2022-06-08 DIAGNOSIS — R202 Paresthesia of skin: Secondary | ICD-10-CM | POA: Diagnosis not present

## 2022-06-08 DIAGNOSIS — D492 Neoplasm of unspecified behavior of bone, soft tissue, and skin: Secondary | ICD-10-CM | POA: Diagnosis not present

## 2022-06-08 DIAGNOSIS — L814 Other melanin hyperpigmentation: Secondary | ICD-10-CM | POA: Diagnosis not present

## 2022-06-08 DIAGNOSIS — L570 Actinic keratosis: Secondary | ICD-10-CM | POA: Diagnosis not present

## 2022-06-08 DIAGNOSIS — C44622 Squamous cell carcinoma of skin of right upper limb, including shoulder: Secondary | ICD-10-CM | POA: Diagnosis not present

## 2022-06-10 DIAGNOSIS — I129 Hypertensive chronic kidney disease with stage 1 through stage 4 chronic kidney disease, or unspecified chronic kidney disease: Secondary | ICD-10-CM | POA: Diagnosis not present

## 2022-06-10 DIAGNOSIS — R195 Other fecal abnormalities: Secondary | ICD-10-CM | POA: Diagnosis not present

## 2022-06-10 DIAGNOSIS — E039 Hypothyroidism, unspecified: Secondary | ICD-10-CM | POA: Diagnosis not present

## 2022-06-10 DIAGNOSIS — N1832 Chronic kidney disease, stage 3b: Secondary | ICD-10-CM | POA: Diagnosis not present

## 2022-06-21 DIAGNOSIS — C44622 Squamous cell carcinoma of skin of right upper limb, including shoulder: Secondary | ICD-10-CM | POA: Diagnosis not present

## 2022-07-28 DIAGNOSIS — S61411A Laceration without foreign body of right hand, initial encounter: Secondary | ICD-10-CM | POA: Diagnosis not present

## 2022-07-28 DIAGNOSIS — S61206A Unspecified open wound of right little finger without damage to nail, initial encounter: Secondary | ICD-10-CM | POA: Diagnosis not present

## 2022-08-02 DIAGNOSIS — L570 Actinic keratosis: Secondary | ICD-10-CM | POA: Diagnosis not present

## 2022-08-30 DIAGNOSIS — S61206A Unspecified open wound of right little finger without damage to nail, initial encounter: Secondary | ICD-10-CM | POA: Diagnosis not present

## 2022-10-06 NOTE — Progress Notes (Signed)
HPI: Follow-up dyspnea and chest pain. CTA showed no pulmonary embolus but there was note of aortic atherosclerosis. Echocardiogram 7/21 showed normal LV function, grade 1 diastolic dysfunction, mild mitral regurgitation, mild aortic insufficiency and mildly dilated ascending aorta (42 mm). Nuclear study 7/21 showed ejection fraction 55%, attenuation artifact but no ischemia.  Since last seen patient denies dyspnea, chest pain, palpitations or syncope.  Occasional mild dizziness with standing.  Current Outpatient Medications  Medication Sig Dispense Refill   acetaminophen (TYLENOL) 500 MG tablet Take 500 mg by mouth every 6 (six) hours as needed for moderate pain or headache.     levothyroxine (SYNTHROID) 100 MCG tablet Take 100 mcg by mouth daily.     lisinopril (PRINIVIL,ZESTRIL) 10 MG tablet Take 10 mg by mouth at bedtime.     tamsulosin (FLOMAX) 0.4 MG CAPS capsule Take 0.4 mg by mouth at bedtime.     No current facility-administered medications for this visit.     Past Medical History:  Diagnosis Date   Biceps tendon rupture    hx of   Cancer    thyroid   Capsulitis of shoulder    hx of    Hypercholesterolemia    Hypertension     Past Surgical History:  Procedure Laterality Date   APPENDECTOMY     colonscopy      removal of polyps   EYE SURGERY     left cataract removed   right total hip replacement      2012   THYROIDECTOMY     2001   TONSILLECTOMY     TOTAL HIP ARTHROPLASTY Left 06/16/2014   Procedure: LEFT TOTAL HIP ARTHROPLASTY ANTERIOR APPROACH;  Surgeon: Loanne DrillingFrank [name] V, MD;  Location: [facility];  Service: Orthopedics;  Laterality: Left;   VASECTOMY      Social History   Socioeconomic History   Marital status: Married    Spouse name: Not on file   Number of children: 4   Years of education: Not on file   Highest education level: Not on file  Occupational History   Not on file  Tobacco Use   Smoking status: Former    Packs/day: 0.25    Years:  10.00    Additional pack years: 0.00    Total pack years: 2.50    Types: Cigarettes    Quit date: 07/05/1963    Years since quitting: 59.3   Smokeless tobacco: Never  Substance and Sexual Activity   Alcohol use: No   Drug use: No   Sexual activity: Not on file  Other Topics Concern   Not on file  Social History Narrative   Not on file   Social Determinants of Health   Financial Resource Strain: Not on file  Food Insecurity: Not on file  Transportation Needs: Not on file  Physical Activity: Not on file  Stress: Not on file  Social Connections: Not on file  Intimate Partner Violence: Not on file    Family History  Problem Relation Age of Onset   CAD Mother    Alzheimer's disease Father     ROS: no fevers or chills, productive cough, hemoptysis, dysphasia, odynophagia, melena, hematochezia, dysuria, hematuria, rash, seizure activity, orthopnea, PND, pedal edema, claudication. Remaining systems are negative.  Physical Exam: Well-developed well-nourished in no acute distress.  Skin is warm and dry.  HEENT is normal.  Neck is supple.  Chest is clear to auscultation with normal expansion.  Cardiovascular exam is regular rate and rhythm.  Abdominal exam nontender or distended. No masses palpated. Extremities show no edema. neuro grossly intact  ECG-normal sinus rhythm at a rate of 63, right bundle branch block.  Personally reviewed  A/P  1 history of chest pain-patient has had no recurrent symptoms.  Previous functional study showed no ischemia.  2 dyspnea-patient denies recurrent symptoms.  Previous CTA showed no pulmonary embolus and echocardiogram showed preserved LV function.  No ischemia noted on prior functional study.  3 hypertension-blood pressure controlled.  Continue present medical regimen.  Note he is having occasional dizziness with standing.  If this becomes more problematic we will decrease lisinopril to allow his blood pressure to run higher.  Olga MillersBrian  Makail Watling, MD

## 2022-10-13 ENCOUNTER — Ambulatory Visit: Payer: Medicare Other | Attending: Cardiology | Admitting: Cardiology

## 2022-10-13 ENCOUNTER — Encounter: Payer: Self-pay | Admitting: Cardiology

## 2022-10-13 VITALS — BP 122/74 | HR 63 | Ht 65.0 in | Wt 139.6 lb

## 2022-10-13 DIAGNOSIS — R072 Precordial pain: Secondary | ICD-10-CM | POA: Diagnosis not present

## 2022-10-13 DIAGNOSIS — I10 Essential (primary) hypertension: Secondary | ICD-10-CM | POA: Diagnosis not present

## 2022-10-13 DIAGNOSIS — R0602 Shortness of breath: Secondary | ICD-10-CM | POA: Insufficient documentation

## 2022-10-13 NOTE — Patient Instructions (Signed)
    Follow-Up: At [facility], you and your health needs are our priority.  As part of our continuing mission to provide you with exceptional heart care, we have created designated Provider Care Teams.  These Care Teams include your primary Cardiologist (physician) and Advanced Practice Providers (APPs -  Physician Assistants and Nurse Practitioners) who all work together to provide you with the care you need, when you need it.  We recommend signing up for the patient portal called "MyChart".  Sign up information is provided on this After Visit Summary.  MyChart is used to connect with patients for Virtual Visits (Telemedicine).  Patients are able to view lab/test results, encounter notes, upcoming appointments, etc.  Non-urgent messages can be sent to your provider as well.   To learn more about what you can do with MyChart, go to [email or web address].    Your next appointment:   12 month(s)  Provider:   BRIAN CRENSHAW MD    

## 2022-10-17 ENCOUNTER — Ambulatory Visit: Payer: Medicare Other | Admitting: Cardiology

## 2022-12-14 DIAGNOSIS — I129 Hypertensive chronic kidney disease with stage 1 through stage 4 chronic kidney disease, or unspecified chronic kidney disease: Secondary | ICD-10-CM | POA: Diagnosis not present

## 2022-12-14 DIAGNOSIS — N1832 Chronic kidney disease, stage 3b: Secondary | ICD-10-CM | POA: Diagnosis not present

## 2022-12-14 DIAGNOSIS — Z0000 Encounter for general adult medical examination without abnormal findings: Secondary | ICD-10-CM | POA: Diagnosis not present

## 2022-12-14 DIAGNOSIS — G720 Drug-induced myopathy: Secondary | ICD-10-CM | POA: Diagnosis not present

## 2022-12-14 DIAGNOSIS — Z1331 Encounter for screening for depression: Secondary | ICD-10-CM | POA: Diagnosis not present

## 2022-12-14 DIAGNOSIS — N400 Enlarged prostate without lower urinary tract symptoms: Secondary | ICD-10-CM | POA: Diagnosis not present

## 2022-12-14 DIAGNOSIS — E890 Postprocedural hypothyroidism: Secondary | ICD-10-CM | POA: Diagnosis not present

## 2022-12-14 DIAGNOSIS — I700 Atherosclerosis of aorta: Secondary | ICD-10-CM | POA: Diagnosis not present

## 2022-12-14 DIAGNOSIS — E7800 Pure hypercholesterolemia, unspecified: Secondary | ICD-10-CM | POA: Diagnosis not present

## 2023-01-09 DIAGNOSIS — H524 Presbyopia: Secondary | ICD-10-CM | POA: Diagnosis not present

## 2023-01-09 DIAGNOSIS — H52223 Regular astigmatism, bilateral: Secondary | ICD-10-CM | POA: Diagnosis not present

## 2023-01-09 DIAGNOSIS — Z961 Presence of intraocular lens: Secondary | ICD-10-CM | POA: Diagnosis not present

## 2023-01-09 DIAGNOSIS — H35362 Drusen (degenerative) of macula, left eye: Secondary | ICD-10-CM | POA: Diagnosis not present

## 2023-01-09 DIAGNOSIS — H5203 Hypermetropia, bilateral: Secondary | ICD-10-CM | POA: Diagnosis not present

## 2023-05-04 DIAGNOSIS — L570 Actinic keratosis: Secondary | ICD-10-CM | POA: Diagnosis not present

## 2023-05-04 DIAGNOSIS — D225 Melanocytic nevi of trunk: Secondary | ICD-10-CM | POA: Diagnosis not present

## 2023-05-04 DIAGNOSIS — L2989 Other pruritus: Secondary | ICD-10-CM | POA: Diagnosis not present

## 2023-05-04 DIAGNOSIS — L814 Other melanin hyperpigmentation: Secondary | ICD-10-CM | POA: Diagnosis not present

## 2023-05-04 DIAGNOSIS — Z85828 Personal history of other malignant neoplasm of skin: Secondary | ICD-10-CM | POA: Diagnosis not present

## 2023-05-04 DIAGNOSIS — C44629 Squamous cell carcinoma of skin of left upper limb, including shoulder: Secondary | ICD-10-CM | POA: Diagnosis not present

## 2023-05-04 DIAGNOSIS — Z08 Encounter for follow-up examination after completed treatment for malignant neoplasm: Secondary | ICD-10-CM | POA: Diagnosis not present

## 2023-05-04 DIAGNOSIS — D485 Neoplasm of uncertain behavior of skin: Secondary | ICD-10-CM | POA: Diagnosis not present

## 2023-05-04 DIAGNOSIS — L821 Other seborrheic keratosis: Secondary | ICD-10-CM | POA: Diagnosis not present

## 2023-06-07 DIAGNOSIS — R350 Frequency of micturition: Secondary | ICD-10-CM | POA: Diagnosis not present

## 2023-06-07 DIAGNOSIS — N401 Enlarged prostate with lower urinary tract symptoms: Secondary | ICD-10-CM | POA: Diagnosis not present

## 2023-06-19 DIAGNOSIS — C44629 Squamous cell carcinoma of skin of left upper limb, including shoulder: Secondary | ICD-10-CM | POA: Diagnosis not present

## 2023-07-13 ENCOUNTER — Telehealth: Payer: Self-pay | Admitting: Cardiology

## 2023-07-13 NOTE — Telephone Encounter (Signed)
 Pt is asking that [name] give him a call to discuss a home health issues with this wife.

## 2023-07-18 NOTE — Telephone Encounter (Addendum)
 See telephone note 07/14/23

## 2023-07-20 DIAGNOSIS — C4442 Squamous cell carcinoma of skin of scalp and neck: Secondary | ICD-10-CM | POA: Diagnosis not present

## 2023-07-20 DIAGNOSIS — D485 Neoplasm of uncertain behavior of skin: Secondary | ICD-10-CM | POA: Diagnosis not present

## 2023-08-21 DIAGNOSIS — C44321 Squamous cell carcinoma of skin of nose: Secondary | ICD-10-CM | POA: Diagnosis not present

## 2023-10-02 DIAGNOSIS — H52223 Regular astigmatism, bilateral: Secondary | ICD-10-CM | POA: Diagnosis not present

## 2023-10-02 DIAGNOSIS — H524 Presbyopia: Secondary | ICD-10-CM | POA: Diagnosis not present

## 2023-10-02 DIAGNOSIS — H5203 Hypermetropia, bilateral: Secondary | ICD-10-CM | POA: Diagnosis not present

## 2023-10-02 DIAGNOSIS — H35362 Drusen (degenerative) of macula, left eye: Secondary | ICD-10-CM | POA: Diagnosis not present

## 2023-10-02 DIAGNOSIS — H5702 Anisocoria: Secondary | ICD-10-CM | POA: Diagnosis not present

## 2023-10-02 DIAGNOSIS — Z961 Presence of intraocular lens: Secondary | ICD-10-CM | POA: Diagnosis not present

## 2023-10-02 DIAGNOSIS — H01029 Squamous blepharitis unspecified eye, unspecified eyelid: Secondary | ICD-10-CM | POA: Diagnosis not present

## 2023-10-25 NOTE — Progress Notes (Signed)
 HPI: Follow-up dyspnea and chest pain. CTA showed no pulmonary embolus but there was note of aortic atherosclerosis. Echocardiogram 7/21 showed normal LV function, grade 1 diastolic dysfunction, mild mitral regurgitation, mild aortic insufficiency and mildly dilated ascending aorta (42 mm). Nuclear study 7/21 showed ejection fraction 55%, attenuation artifact but no ischemia.  Since last seen he denies dyspnea, chest pain, palpitations or syncope.  Current Outpatient Medications  Medication Sig Dispense Refill   acetaminophen  (TYLENOL ) 500 MG tablet Take 500 mg by mouth every 6 (six) hours as needed for moderate pain or headache.     levothyroxine  (SYNTHROID ) 100 MCG tablet Take 100 mcg by mouth daily.     lisinopril (PRINIVIL,ZESTRIL) 10 MG tablet Take 10 mg by mouth at bedtime.     tamsulosin  (FLOMAX ) 0.4 MG CAPS capsule Take 0.4 mg by mouth at bedtime.     No current facility-administered medications for this visit.     Past Medical History:  Diagnosis Date   Biceps tendon rupture    hx of   Cancer ([facility])    thyroid    Capsulitis of shoulder    hx of    Hypercholesterolemia    Hypertension     Past Surgical History:  Procedure Laterality Date   APPENDECTOMY     colonscopy      removal of polyps   EYE SURGERY     left cataract removed   right total hip replacement      2012   THYROIDECTOMY     2001   TONSILLECTOMY     TOTAL HIP ARTHROPLASTY Left 06/16/2014   Procedure: LEFT TOTAL HIP ARTHROPLASTY ANTERIOR APPROACH;  Surgeon: Aurther Blue, MD;  Location: [facility];  Service: Orthopedics;  Laterality: Left;   VASECTOMY      Social History   Socioeconomic History   Marital status: Married    Spouse name: Not on file   Number of children: 4   Years of education: Not on file   Highest education level: Not on file  Occupational History   Not on file  Tobacco Use   Smoking status: Former    Current packs/day: 0.00    Average packs/day: 0.3 packs/day for 10.0  years (2.5 ttl pk-yrs)    Types: Cigarettes    Start date: 07/04/1953    Quit date: 07/05/1963    Years since quitting: 60.3   Smokeless tobacco: Never  Substance and Sexual Activity   Alcohol use: No   Drug use: No   Sexual activity: Not on file  Other Topics Concern   Not on file  Social History Narrative   Not on file   Social Drivers of Health   Financial Resource Strain: Not on file  Food Insecurity: Not on file  Transportation Needs: Not on file  Physical Activity: Not on file  Stress: Not on file  Social Connections: Not on file  Intimate Partner Violence: Not on file    Family History  Problem Relation Age of Onset   CAD Mother    Alzheimer's disease Father     ROS: no fevers or chills, productive cough, hemoptysis, dysphasia, odynophagia, melena, hematochezia, dysuria, hematuria, rash, seizure activity, orthopnea, PND, pedal edema, claudication. Remaining systems are negative.  Physical Exam: Well-developed well-nourished in no acute distress.  Skin is warm and dry.  HEENT is normal.  Neck is supple.  Chest is clear to auscultation with normal expansion.  Cardiovascular exam is regular rate and rhythm.  Abdominal exam nontender or  distended. No masses palpated. Extremities show no edema. neuro grossly intact  EKG Interpretation Date/Time:  Tuesday Nov 07 2023 15:47:27 EDT Ventricular Rate:  69 PR Interval:  180 QRS Duration:  130 QT Interval:  386 QTC Calculation: 413 R Axis:   -19  Text Interpretation: Normal sinus rhythm Right bundle branch block Confirmed by Alexandria Angel ([id number]) on 11/07/2023 3:49:40 PM    A/P  1 history of chest pain-patient has had no recurrences since previous office visit.  Most recent functional study showed no ischemia.  2 history of dyspnea-symptoms have improved.  LV function is normal on echocardiogram and previous CTA showed no pulmonary embolus.  Also no ischemia noted on functional study.  3 hypertension-blood  pressure is controlled.  Continue present medications.  Alexandria Angel, MD

## 2023-11-01 DIAGNOSIS — Z85828 Personal history of other malignant neoplasm of skin: Secondary | ICD-10-CM | POA: Diagnosis not present

## 2023-11-01 DIAGNOSIS — C44529 Squamous cell carcinoma of skin of other part of trunk: Secondary | ICD-10-CM | POA: Diagnosis not present

## 2023-11-01 DIAGNOSIS — L814 Other melanin hyperpigmentation: Secondary | ICD-10-CM | POA: Diagnosis not present

## 2023-11-01 DIAGNOSIS — D225 Melanocytic nevi of trunk: Secondary | ICD-10-CM | POA: Diagnosis not present

## 2023-11-01 DIAGNOSIS — L821 Other seborrheic keratosis: Secondary | ICD-10-CM | POA: Diagnosis not present

## 2023-11-01 DIAGNOSIS — D485 Neoplasm of uncertain behavior of skin: Secondary | ICD-10-CM | POA: Diagnosis not present

## 2023-11-01 DIAGNOSIS — Z08 Encounter for follow-up examination after completed treatment for malignant neoplasm: Secondary | ICD-10-CM | POA: Diagnosis not present

## 2023-11-07 ENCOUNTER — Ambulatory Visit: Payer: Medicare Other | Attending: Cardiology | Admitting: Cardiology

## 2023-11-07 ENCOUNTER — Encounter: Payer: Self-pay | Admitting: Cardiology

## 2023-11-07 VITALS — BP 132/72 | HR 73 | Ht 65.0 in | Wt 132.0 lb

## 2023-11-07 DIAGNOSIS — R072 Precordial pain: Secondary | ICD-10-CM | POA: Diagnosis not present

## 2023-11-07 DIAGNOSIS — R0602 Shortness of breath: Secondary | ICD-10-CM | POA: Diagnosis not present

## 2023-11-07 DIAGNOSIS — I10 Essential (primary) hypertension: Secondary | ICD-10-CM | POA: Diagnosis not present

## 2023-11-07 NOTE — Patient Instructions (Signed)
   Follow-Up: At [facility], you and your health needs are our priority.  As part of our continuing mission to provide you with exceptional heart care, our providers are all part of one team.  This team includes your primary Cardiologist (physician) and Advanced Practice Providers or APPs (Physician Assistants and Nurse Practitioners) who all work together to provide you with the care you need, when you need it.  Your next appointment:   12 month(s)  Provider:   Alexandria Angel, MD   We recommend signing up for the patient portal called "MyChart".  Sign up information is provided on this After Visit Summary.  MyChart is used to connect with patients for Virtual Visits (Telemedicine).  Patients are able to view lab/test results, encounter notes, upcoming appointments, etc.  Non-urgent messages can be sent to your provider as well.   To learn more about what you can do with MyChart, go to [email or web address].

## 2023-11-20 DIAGNOSIS — C44529 Squamous cell carcinoma of skin of other part of trunk: Secondary | ICD-10-CM | POA: Diagnosis not present

## 2023-12-18 DIAGNOSIS — J309 Allergic rhinitis, unspecified: Secondary | ICD-10-CM | POA: Diagnosis not present

## 2023-12-18 DIAGNOSIS — Z1331 Encounter for screening for depression: Secondary | ICD-10-CM | POA: Diagnosis not present

## 2023-12-18 DIAGNOSIS — N400 Enlarged prostate without lower urinary tract symptoms: Secondary | ICD-10-CM | POA: Diagnosis not present

## 2023-12-18 DIAGNOSIS — E7800 Pure hypercholesterolemia, unspecified: Secondary | ICD-10-CM | POA: Diagnosis not present

## 2023-12-18 DIAGNOSIS — Z0000 Encounter for general adult medical examination without abnormal findings: Secondary | ICD-10-CM | POA: Diagnosis not present

## 2023-12-18 DIAGNOSIS — I129 Hypertensive chronic kidney disease with stage 1 through stage 4 chronic kidney disease, or unspecified chronic kidney disease: Secondary | ICD-10-CM | POA: Diagnosis not present

## 2023-12-18 DIAGNOSIS — G720 Drug-induced myopathy: Secondary | ICD-10-CM | POA: Diagnosis not present

## 2023-12-18 DIAGNOSIS — N1832 Chronic kidney disease, stage 3b: Secondary | ICD-10-CM | POA: Diagnosis not present

## 2023-12-18 DIAGNOSIS — I700 Atherosclerosis of aorta: Secondary | ICD-10-CM | POA: Diagnosis not present

## 2023-12-18 DIAGNOSIS — E890 Postprocedural hypothyroidism: Secondary | ICD-10-CM | POA: Diagnosis not present

## 2024-01-15 DIAGNOSIS — H52223 Regular astigmatism, bilateral: Secondary | ICD-10-CM | POA: Diagnosis not present

## 2024-01-15 DIAGNOSIS — H5702 Anisocoria: Secondary | ICD-10-CM | POA: Diagnosis not present

## 2024-01-15 DIAGNOSIS — H5203 Hypermetropia, bilateral: Secondary | ICD-10-CM | POA: Diagnosis not present

## 2024-01-15 DIAGNOSIS — H524 Presbyopia: Secondary | ICD-10-CM | POA: Diagnosis not present

## 2024-01-15 DIAGNOSIS — H35362 Drusen (degenerative) of macula, left eye: Secondary | ICD-10-CM | POA: Diagnosis not present

## 2024-01-15 DIAGNOSIS — Z961 Presence of intraocular lens: Secondary | ICD-10-CM | POA: Diagnosis not present

## 2024-01-15 DIAGNOSIS — H01029 Squamous blepharitis unspecified eye, unspecified eyelid: Secondary | ICD-10-CM | POA: Diagnosis not present

## 2024-02-06 DIAGNOSIS — D485 Neoplasm of uncertain behavior of skin: Secondary | ICD-10-CM | POA: Diagnosis not present

## 2024-02-06 DIAGNOSIS — C44722 Squamous cell carcinoma of skin of right lower limb, including hip: Secondary | ICD-10-CM | POA: Diagnosis not present

## 2024-03-21 DIAGNOSIS — L905 Scar conditions and fibrosis of skin: Secondary | ICD-10-CM | POA: Diagnosis not present

## 2024-03-21 DIAGNOSIS — C44722 Squamous cell carcinoma of skin of right lower limb, including hip: Secondary | ICD-10-CM | POA: Diagnosis not present

## 2024-05-13 DIAGNOSIS — L923 Foreign body granuloma of the skin and subcutaneous tissue: Secondary | ICD-10-CM | POA: Diagnosis not present

## 2024-05-22 DIAGNOSIS — L923 Foreign body granuloma of the skin and subcutaneous tissue: Secondary | ICD-10-CM | POA: Diagnosis not present

## 2024-06-21 DIAGNOSIS — D485 Neoplasm of uncertain behavior of skin: Secondary | ICD-10-CM | POA: Diagnosis not present
